# Patient Record
Sex: Female | Born: 1945 | Race: White | Hispanic: No | Marital: Married | Smoking: Never smoker
Health system: Southern US, Community
[De-identification: ages and names within clinical notes are randomized; demographics above are authoritative.]

## PROBLEM LIST (undated history)

## (undated) DIAGNOSIS — J189 Pneumonia, unspecified organism: Secondary | ICD-10-CM

## (undated) DIAGNOSIS — M1990 Unspecified osteoarthritis, unspecified site: Secondary | ICD-10-CM

## (undated) DIAGNOSIS — G35 Multiple sclerosis: Secondary | ICD-10-CM

## (undated) DIAGNOSIS — F329 Major depressive disorder, single episode, unspecified: Secondary | ICD-10-CM

## (undated) DIAGNOSIS — K5790 Diverticulosis of intestine, part unspecified, without perforation or abscess without bleeding: Secondary | ICD-10-CM

## (undated) DIAGNOSIS — N319 Neuromuscular dysfunction of bladder, unspecified: Secondary | ICD-10-CM

## (undated) DIAGNOSIS — R413 Other amnesia: Secondary | ICD-10-CM

## (undated) DIAGNOSIS — R269 Unspecified abnormalities of gait and mobility: Secondary | ICD-10-CM

## (undated) DIAGNOSIS — G894 Chronic pain syndrome: Secondary | ICD-10-CM

## (undated) DIAGNOSIS — I10 Essential (primary) hypertension: Secondary | ICD-10-CM

## (undated) DIAGNOSIS — G5620 Lesion of ulnar nerve, unspecified upper limb: Secondary | ICD-10-CM

## (undated) DIAGNOSIS — F419 Anxiety disorder, unspecified: Secondary | ICD-10-CM

## (undated) DIAGNOSIS — J449 Chronic obstructive pulmonary disease, unspecified: Secondary | ICD-10-CM

## (undated) DIAGNOSIS — J45909 Unspecified asthma, uncomplicated: Secondary | ICD-10-CM

## (undated) DIAGNOSIS — F32A Depression, unspecified: Secondary | ICD-10-CM

## (undated) DIAGNOSIS — N390 Urinary tract infection, site not specified: Secondary | ICD-10-CM

## (undated) HISTORY — DX: Other amnesia: R41.3

## (undated) HISTORY — DX: Unspecified abnormalities of gait and mobility: R26.9

## (undated) HISTORY — DX: Neuromuscular dysfunction of bladder, unspecified: N31.9

## (undated) HISTORY — DX: Chronic obstructive pulmonary disease, unspecified: J44.9

## (undated) HISTORY — PX: CATARACT EXTRACTION: SUR2

## (undated) HISTORY — PX: BACK SURGERY: SHX140

## (undated) HISTORY — DX: Chronic pain syndrome: G89.4

## (undated) HISTORY — DX: Essential (primary) hypertension: I10

## (undated) HISTORY — DX: Diverticulosis of intestine, part unspecified, without perforation or abscess without bleeding: K57.90

## (undated) HISTORY — DX: Unspecified asthma, uncomplicated: J45.909

## (undated) HISTORY — PX: TUBAL LIGATION: SHX77

## (undated) HISTORY — DX: Lesion of ulnar nerve, unspecified upper limb: G56.20

## (undated) HISTORY — PX: APPENDECTOMY: SHX54

## (undated) HISTORY — DX: Unspecified osteoarthritis, unspecified site: M19.90

## (undated) HISTORY — PX: TONSILLECTOMY: SUR1361

## (undated) HISTORY — PX: CHOLECYSTECTOMY: SHX55

## (undated) HISTORY — PX: ABDOMINAL HYSTERECTOMY: SHX81

## (undated) HISTORY — DX: Urinary tract infection, site not specified: N39.0

---

## 2011-04-30 ENCOUNTER — Encounter (HOSPITAL_COMMUNITY): Payer: Self-pay | Admitting: Pharmacy Technician

## 2011-05-03 ENCOUNTER — Encounter (HOSPITAL_COMMUNITY)
Admission: RE | Admit: 2011-05-03 | Discharge: 2011-05-03 | Disposition: A | Discharge status: Home / Self Care | Payer: Medicare FFS | Source: Ambulatory Visit | Attending: Orthopedic Surgery | Admitting: Orthopedic Surgery

## 2011-05-03 ENCOUNTER — Other Ambulatory Visit: Payer: Self-pay | Admitting: Orthopedic Surgery

## 2011-05-03 ENCOUNTER — Encounter (HOSPITAL_COMMUNITY): Payer: Self-pay

## 2011-05-03 HISTORY — DX: Major depressive disorder, single episode, unspecified: F32.9

## 2011-05-03 HISTORY — DX: Depression, unspecified: F32.A

## 2011-05-03 HISTORY — DX: Pneumonia, unspecified organism: J18.9

## 2011-05-03 HISTORY — DX: Anxiety disorder, unspecified: F41.9

## 2011-05-03 HISTORY — DX: Multiple sclerosis: G35

## 2011-05-03 HISTORY — DX: Unspecified osteoarthritis, unspecified site: M19.90

## 2011-05-03 LAB — ABO/RH: ABO/RH(D): O POS

## 2011-05-03 LAB — COMPREHENSIVE METABOLIC PANEL
AST: 21 U/L (ref 0–37)
Alkaline Phosphatase: 104 U/L (ref 39–117)
BUN: 14 mg/dL (ref 6–23)
CO2: 28 mEq/L (ref 19–32)
Chloride: 101 mEq/L (ref 96–112)
Creatinine, Ser: 0.76 mg/dL (ref 0.50–1.10)
GFR calc non Af Amer: 86 mL/min — ABNORMAL LOW (ref >90)
Potassium: 3.5 mEq/L (ref 3.5–5.1)
Total Bilirubin: 0.5 mg/dL (ref 0.3–1.2)

## 2011-05-03 LAB — CBC
MCH: 28.1 pg (ref 26.0–34.0)
MCV: 84.6 fL (ref 78.0–100.0)
Platelets: 277 10*3/uL (ref 150–400)
RBC: 4.81 MIL/uL (ref 3.87–5.11)

## 2011-05-03 LAB — URINALYSIS, ROUTINE W REFLEX MICROSCOPIC
Bilirubin Urine: NEGATIVE
Glucose, UA: NEGATIVE mg/dL
Hgb urine dipstick: NEGATIVE
Ketones, ur: NEGATIVE mg/dL
Leukocytes, UA: NEGATIVE
Protein, ur: NEGATIVE mg/dL

## 2011-05-03 LAB — DIFFERENTIAL
Basophils Absolute: 0.1 10*3/uL (ref 0.0–0.1)
Basophils Relative: 1 % (ref 0–1)
Eosinophils Absolute: 0.5 10*3/uL (ref 0.0–0.7)
Monocytes Absolute: 0.7 10*3/uL (ref 0.1–1.0)
Neutro Abs: 4.6 10*3/uL (ref 1.7–7.7)
Neutrophils Relative %: 60 % (ref 43–77)

## 2011-05-03 MED ORDER — CHLORHEXIDINE GLUCONATE 4 % EX LIQD: 60.0000 mL | Freq: Once | CUTANEOUS | Status: DC

## 2011-05-03 NOTE — Progress Notes (Signed)
pcp   Is  Dr Loraine Leriche Leary Roca

## 2011-05-03 NOTE — Pre-Procedure Instructions (Signed)
19 Casey Young  05/03/2011   Your procedure is scheduled on:  05/07/11  Report to [facility] at 915 AM.  Call this number if you have problems the morning of surgery: [phone]   Remember:   Do not eat food:After Midnight.  May have clear liquids: up to 4 Hours before arrival.  Clear liquids include soda, tea, black coffee, apple or grape juice, broth.  Take these medicines the morning of surgery with A SIP OF WATER: xanax,prozac,neurontin,methodone   Do not wear jewelry, make-up or nail polish.  Do not wear lotions, powders, or perfumes. You may wear deodorant.  Do not shave 48 hours prior to surgery.  Do not bring valuables to the hospital.  Contacts, dentures or bridgework may not be worn into surgery.  Leave suitcase in the car. After surgery it may be brought to your room.  For patients admitted to the hospital, checkout time is 11:00 AM the day of discharge.   Patients discharged the day of surgery will not be allowed to drive home.  Name and phone number of your driver: family  Special Instructions: CHG Shower Use Special Wash: 1/2 bottle night before surgery and 1/2 bottle morning of surgery.   Please read over the following fact sheets that you were given: Pain Booklet, Coughing and Deep Breathing, Blood Transfusion Information, MRSA Information and Surgical Site Infection Prevention

## 2011-05-04 LAB — URINE CULTURE
Colony Count: NO GROWTH
Culture  Setup Time: 201304181042
Culture: NO GROWTH

## 2011-05-06 MED ORDER — ACETAMINOPHEN 10 MG/ML IV SOLN
1000.0000 mg | Freq: Four times a day (QID) | INTRAVENOUS | Status: DC
  Filled 2011-05-06 (×4): qty 100

## 2011-05-06 MED ORDER — SODIUM CHLORIDE 0.9 % IV SOLN: INTRAVENOUS | Status: DC

## 2011-05-07 ENCOUNTER — Encounter (HOSPITAL_COMMUNITY): Payer: Self-pay | Admitting: Surgery

## 2011-05-07 ENCOUNTER — Ambulatory Visit (HOSPITAL_COMMUNITY): Payer: Medicare FFS | Admitting: Anesthesiology

## 2011-05-07 ENCOUNTER — Encounter (HOSPITAL_COMMUNITY): Payer: Self-pay | Admitting: Anesthesiology

## 2011-05-07 ENCOUNTER — Inpatient Hospital Stay (HOSPITAL_COMMUNITY)
Admission: RE | Admit: 2011-05-07 | Discharge: 2011-05-08 | DRG: 483 | Disposition: A | Discharge status: Home Health | Payer: Medicare FFS | Source: Ambulatory Visit | Attending: Orthopedic Surgery | Admitting: Orthopedic Surgery

## 2011-05-07 ENCOUNTER — Encounter (HOSPITAL_COMMUNITY)
Admission: RE | Disposition: A | Discharge status: Home Health | Payer: Self-pay | Source: Ambulatory Visit | Attending: Orthopedic Surgery

## 2011-05-07 DIAGNOSIS — Z880 Allergy status to penicillin: Secondary | ICD-10-CM

## 2011-05-07 DIAGNOSIS — Z79899 Other long term (current) drug therapy: Secondary | ICD-10-CM

## 2011-05-07 DIAGNOSIS — M19019 Primary osteoarthritis, unspecified shoulder: Principal | ICD-10-CM | POA: Diagnosis present

## 2011-05-07 DIAGNOSIS — D62 Acute posthemorrhagic anemia: Secondary | ICD-10-CM | POA: Diagnosis not present

## 2011-05-07 DIAGNOSIS — Z01812 Encounter for preprocedural laboratory examination: Secondary | ICD-10-CM

## 2011-05-07 DIAGNOSIS — G35 Multiple sclerosis: Secondary | ICD-10-CM | POA: Diagnosis present

## 2011-05-07 DIAGNOSIS — F341 Dysthymic disorder: Secondary | ICD-10-CM | POA: Diagnosis present

## 2011-05-07 HISTORY — PX: SHOULDER HEMI-ARTHROPLASTY: SHX5049

## 2011-05-07 SURGERY — HEMIARTHROPLASTY, SHOULDER
Anesthesia: General | Site: Shoulder | Laterality: Left | Wound class: Clean

## 2011-05-07 MED ORDER — SODIUM CHLORIDE 0.9 % IV SOLN
INTRAVENOUS | Status: DC | PRN
  Administered 2011-05-07: 11:00:00 via INTRAVENOUS

## 2011-05-07 MED ORDER — SODIUM CHLORIDE 0.9 % IV SOLN
INTRAVENOUS | Status: DC
  Administered 2011-05-07: 15:00:00 via INTRAVENOUS

## 2011-05-07 MED ORDER — ACETAMINOPHEN 650 MG RE SUPP: 650.0000 mg | Freq: Four times a day (QID) | RECTAL | Status: DC | PRN

## 2011-05-07 MED ORDER — BUPIVACAINE-EPINEPHRINE PF 0.5-1:200000 % IJ SOLN
INTRAMUSCULAR | Status: DC | PRN
  Administered 2011-05-07: 30 mL

## 2011-05-07 MED ORDER — HYDROMORPHONE HCL PF 1 MG/ML IJ SOLN: 0.5000 mg | INTRAMUSCULAR | Status: DC | PRN

## 2011-05-07 MED ORDER — ALPRAZOLAM 0.5 MG PO TABS
0.5000 mg | ORAL_TABLET | Freq: Three times a day (TID) | ORAL | Status: DC
  Administered 2011-05-07 – 2011-05-08 (×4): 0.5 mg via ORAL
  Filled 2011-05-07 (×4): qty 1

## 2011-05-07 MED ORDER — LACTATED RINGERS IV SOLN
INTRAVENOUS | Status: DC
  Administered 2011-05-07: 10:00:00 via INTRAVENOUS

## 2011-05-07 MED ORDER — METHADONE HCL 10 MG PO TABS
10.0000 mg | ORAL_TABLET | Freq: Two times a day (BID) | ORAL | Status: DC
  Administered 2011-05-07 – 2011-05-08 (×2): 10 mg via ORAL
  Filled 2011-05-07 (×2): qty 1

## 2011-05-07 MED ORDER — FLEET ENEMA 7-19 GM/118ML RE ENEM: 1.0000 | ENEMA | Freq: Once | RECTAL | Status: AC | PRN

## 2011-05-07 MED ORDER — ROCURONIUM BROMIDE 100 MG/10ML IV SOLN
INTRAVENOUS | Status: DC | PRN
  Administered 2011-05-07: 50 mg via INTRAVENOUS

## 2011-05-07 MED ORDER — MIDAZOLAM HCL 2 MG/2ML IJ SOLN
2.0000 mg | INTRAMUSCULAR | Status: DC | PRN
  Administered 2011-05-07: 2 mg via INTRAVENOUS

## 2011-05-07 MED ORDER — FENTANYL CITRATE 0.05 MG/ML IJ SOLN
100.0000 ug | INTRAMUSCULAR | Status: DC | PRN
  Administered 2011-05-07: 100 ug via INTRAVENOUS

## 2011-05-07 MED ORDER — ONDANSETRON HCL 4 MG/2ML IJ SOLN: 4.0000 mg | Freq: Four times a day (QID) | INTRAMUSCULAR | Status: DC | PRN

## 2011-05-07 MED ORDER — MIDAZOLAM HCL 2 MG/2ML IJ SOLN
INTRAMUSCULAR | Status: AC
  Filled 2011-05-07: qty 2

## 2011-05-07 MED ORDER — ACETAMINOPHEN 10 MG/ML IV SOLN
INTRAVENOUS | Status: AC
  Filled 2011-05-07: qty 100

## 2011-05-07 MED ORDER — PHENOL 1.4 % MT LIQD: 1.0000 | OROMUCOSAL | Status: DC | PRN

## 2011-05-07 MED ORDER — EPHEDRINE SULFATE 50 MG/ML IJ SOLN
INTRAMUSCULAR | Status: DC | PRN
  Administered 2011-05-07: 7.5 mg via INTRAVENOUS

## 2011-05-07 MED ORDER — ONDANSETRON HCL 4 MG PO TABS: 4.0000 mg | ORAL_TABLET | Freq: Four times a day (QID) | ORAL | Status: DC | PRN

## 2011-05-07 MED ORDER — HYDROMORPHONE HCL PF 1 MG/ML IJ SOLN
0.2500 mg | INTRAMUSCULAR | Status: DC | PRN
  Administered 2011-05-07: 0.25 mg via INTRAVENOUS

## 2011-05-07 MED ORDER — HETASTARCH-ELECTROLYTES 6 % IV SOLN
INTRAVENOUS | Status: DC | PRN
  Administered 2011-05-07: 12:00:00 via INTRAVENOUS

## 2011-05-07 MED ORDER — AMITRIPTYLINE HCL 100 MG PO TABS
100.0000 mg | ORAL_TABLET | Freq: Every day | ORAL | Status: DC
  Administered 2011-05-07: 100 mg via ORAL
  Filled 2011-05-07 (×2): qty 1

## 2011-05-07 MED ORDER — LIDOCAINE HCL (CARDIAC) 20 MG/ML IV SOLN
INTRAVENOUS | Status: DC | PRN
  Administered 2011-05-07: 90 mg via INTRAVENOUS

## 2011-05-07 MED ORDER — ACETAMINOPHEN 10 MG/ML IV SOLN
1000.0000 mg | Freq: Once | INTRAVENOUS | Status: DC
  Filled 2011-05-07: qty 100

## 2011-05-07 MED ORDER — VANCOMYCIN HCL IN DEXTROSE 1-5 GM/200ML-% IV SOLN
1000.0000 mg | Freq: Once | INTRAVENOUS | Status: AC
  Administered 2011-05-07: 1000 mg via INTRAVENOUS

## 2011-05-07 MED ORDER — METOCLOPRAMIDE HCL 5 MG/ML IJ SOLN: 5.0000 mg | Freq: Three times a day (TID) | INTRAMUSCULAR | Status: DC | PRN

## 2011-05-07 MED ORDER — VANCOMYCIN HCL IN DEXTROSE 1-5 GM/200ML-% IV SOLN
1000.0000 mg | Freq: Two times a day (BID) | INTRAVENOUS | Status: AC
  Administered 2011-05-07: 1000 mg via INTRAVENOUS
  Filled 2011-05-07: qty 200

## 2011-05-07 MED ORDER — FENTANYL CITRATE 0.05 MG/ML IJ SOLN
INTRAMUSCULAR | Status: DC | PRN
  Administered 2011-05-07: 100 ug via INTRAVENOUS

## 2011-05-07 MED ORDER — MELOXICAM 15 MG PO TABS
15.0000 mg | ORAL_TABLET | Freq: Every day | ORAL | Status: DC
  Administered 2011-05-07 – 2011-05-08 (×2): 15 mg via ORAL
  Filled 2011-05-07 (×2): qty 1

## 2011-05-07 MED ORDER — ZOLPIDEM TARTRATE 5 MG PO TABS: 5.0000 mg | ORAL_TABLET | Freq: Every evening | ORAL | Status: DC | PRN

## 2011-05-07 MED ORDER — FENTANYL CITRATE 0.05 MG/ML IJ SOLN
INTRAMUSCULAR | Status: AC
  Filled 2011-05-07: qty 2

## 2011-05-07 MED ORDER — DEXTROSE 5 % IV SOLN: 500.0000 mg | Freq: Four times a day (QID) | INTRAVENOUS | Status: DC | PRN

## 2011-05-07 MED ORDER — FLUOXETINE HCL 20 MG PO CAPS
40.0000 mg | ORAL_CAPSULE | Freq: Every day | ORAL | Status: DC
  Administered 2011-05-07 – 2011-05-08 (×2): 40 mg via ORAL
  Filled 2011-05-07 (×2): qty 2

## 2011-05-07 MED ORDER — MENTHOL 3 MG MT LOZG: 1.0000 | LOZENGE | OROMUCOSAL | Status: DC | PRN

## 2011-05-07 MED ORDER — PROPOFOL 10 MG/ML IV EMUL
INTRAVENOUS | Status: DC | PRN
  Administered 2011-05-07: 160 mg via INTRAVENOUS

## 2011-05-07 MED ORDER — SENNOSIDES-DOCUSATE SODIUM 8.6-50 MG PO TABS: 1.0000 | ORAL_TABLET | Freq: Every evening | ORAL | Status: DC | PRN

## 2011-05-07 MED ORDER — SODIUM CHLORIDE 0.9 % IR SOLN
Status: DC | PRN
  Administered 2011-05-07: 1000 mL

## 2011-05-07 MED ORDER — ONDANSETRON HCL 4 MG/2ML IJ SOLN
INTRAMUSCULAR | Status: DC | PRN
  Administered 2011-05-07: 4 mg via INTRAVENOUS

## 2011-05-07 MED ORDER — LACTATED RINGERS IV SOLN
INTRAVENOUS | Status: DC | PRN
  Administered 2011-05-07 (×2): via INTRAVENOUS

## 2011-05-07 MED ORDER — DOCUSATE SODIUM 100 MG PO CAPS
100.0000 mg | ORAL_CAPSULE | Freq: Two times a day (BID) | ORAL | Status: DC
  Administered 2011-05-07 – 2011-05-08 (×2): 100 mg via ORAL
  Filled 2011-05-07 (×3): qty 1

## 2011-05-07 MED ORDER — SODIUM CHLORIDE 0.9 % IV SOLN
10.0000 mg | INTRAVENOUS | Status: DC | PRN
  Administered 2011-05-07: 80 ug/min via INTRAVENOUS

## 2011-05-07 MED ORDER — METHOCARBAMOL 500 MG PO TABS
500.0000 mg | ORAL_TABLET | Freq: Four times a day (QID) | ORAL | Status: DC | PRN
  Administered 2011-05-07: 500 mg via ORAL
  Filled 2011-05-07: qty 1

## 2011-05-07 MED ORDER — PHENYLEPHRINE HCL 10 MG/ML IJ SOLN
INTRAMUSCULAR | Status: DC | PRN
  Administered 2011-05-07 (×6): 80 ug via INTRAVENOUS

## 2011-05-07 MED ORDER — GLYCOPYRROLATE 0.2 MG/ML IJ SOLN
INTRAMUSCULAR | Status: DC | PRN
  Administered 2011-05-07: 0.6 mg via INTRAVENOUS

## 2011-05-07 MED ORDER — METOCLOPRAMIDE HCL 10 MG PO TABS: 5.0000 mg | ORAL_TABLET | Freq: Three times a day (TID) | ORAL | Status: DC | PRN

## 2011-05-07 MED ORDER — OXYCODONE HCL 10 MG PO TB12
10.0000 mg | ORAL_TABLET | Freq: Two times a day (BID) | ORAL | Status: DC
  Administered 2011-05-07 – 2011-05-08 (×2): 10 mg via ORAL
  Filled 2011-05-07 (×2): qty 1

## 2011-05-07 MED ORDER — ACETAMINOPHEN 325 MG PO TABS: 650.0000 mg | ORAL_TABLET | Freq: Four times a day (QID) | ORAL | Status: DC | PRN

## 2011-05-07 MED ORDER — BISACODYL 5 MG PO TBEC: 5.0000 mg | DELAYED_RELEASE_TABLET | Freq: Every day | ORAL | Status: DC | PRN

## 2011-05-07 MED ORDER — ALUM & MAG HYDROXIDE-SIMETH 200-200-20 MG/5ML PO SUSP: 30.0000 mL | ORAL | Status: DC | PRN

## 2011-05-07 MED ORDER — OXYCODONE HCL 5 MG PO TABS
5.0000 mg | ORAL_TABLET | ORAL | Status: DC | PRN
  Administered 2011-05-08 (×2): 10 mg via ORAL
  Filled 2011-05-07 (×2): qty 2

## 2011-05-07 MED ORDER — GABAPENTIN 300 MG PO CAPS
600.0000 mg | ORAL_CAPSULE | Freq: Three times a day (TID) | ORAL | Status: DC
  Administered 2011-05-07 – 2011-05-08 (×4): 600 mg via ORAL
  Filled 2011-05-07 (×5): qty 2

## 2011-05-07 MED ORDER — DIPHENHYDRAMINE HCL 12.5 MG/5ML PO ELIX: 12.5000 mg | ORAL_SOLUTION | ORAL | Status: DC | PRN

## 2011-05-07 MED ORDER — NEOSTIGMINE METHYLSULFATE 1 MG/ML IJ SOLN
INTRAMUSCULAR | Status: DC | PRN
  Administered 2011-05-07: 4 mg via INTRAVENOUS

## 2011-05-07 SURGICAL SUPPLY — 64 items
APL SKNCLS STERI-STRIP NONHPOA (GAUZE/BANDAGES/DRESSINGS) ×2
BENZOIN TINCTURE PRP APPL 2/3 (GAUZE/BANDAGES/DRESSINGS) ×2 IMPLANT
BLADE SAGITTAL 25.0X1.19X90 (BLADE) ×3 IMPLANT
BOWL SMART MIX CTS (DISPOSABLE) IMPLANT
CLOTH BEACON ORANGE TIMEOUT ST (SAFETY) ×3 IMPLANT
COVER SURGICAL LIGHT HANDLE (MISCELLANEOUS) ×3 IMPLANT
COVER TABLE BACK 60X90 (DRAPES) ×1 IMPLANT
DRAPE C-ARM 42X72 X-RAY (DRAPES) IMPLANT
DRAPE INCISE IOBAN 66X45 STRL (DRAPES) ×2 IMPLANT
DRAPE PROXIMA HALF (DRAPES) ×1 IMPLANT
DRAPE U-SHAPE 47X51 STRL (DRAPES) ×4 IMPLANT
DRSG PAD ABDOMINAL 8X10 ST (GAUZE/BANDAGES/DRESSINGS) ×4 IMPLANT
DURAPREP 26ML APPLICATOR (WOUND CARE) ×3 IMPLANT
ELECT CAUTERY BLADE 6.4 (BLADE) ×3 IMPLANT
ELECT NDL TIP 2.8 STRL (NEEDLE) IMPLANT
ELECT NEEDLE TIP 2.8 STRL (NEEDLE) ×3 IMPLANT
ELECT REM PT RETURN 9FT ADLT (ELECTROSURGICAL) ×3
ELECTRODE REM PT RTRN 9FT ADLT (ELECTROSURGICAL) ×2 IMPLANT
EVACUATOR 1/8 PVC DRAIN (DRAIN) IMPLANT
GAUZE XEROFORM 5X9 LF (GAUZE/BANDAGES/DRESSINGS) ×3 IMPLANT
GLOVE BIOGEL M 7.0 STRL (GLOVE) ×2 IMPLANT
GLOVE BIOGEL PI IND STRL 7.5 (GLOVE) ×1 IMPLANT
GLOVE BIOGEL PI IND STRL 8.5 (GLOVE) ×3 IMPLANT
GLOVE BIOGEL PI INDICATOR 7.5 (GLOVE) ×1
GLOVE BIOGEL PI INDICATOR 8.5 (GLOVE) ×1
GLOVE EXAM NITRILE XL STR (GLOVE) ×2 IMPLANT
GLOVE SURG ORTHO 8.0 STRL STRW (GLOVE) ×4 IMPLANT
GLOVE SURG SS PI 7.5 STRL IVOR (GLOVE) ×4 IMPLANT
GOWN PREVENTION PLUS XLARGE (GOWN DISPOSABLE) ×5 IMPLANT
GOWN STRL NON-REIN LRG LVL3 (GOWN DISPOSABLE) ×4 IMPLANT
HANDPIECE INTERPULSE COAX TIP (DISPOSABLE)
HOOD PEEL AWAY FACE SHEILD DIS (HOOD) ×9 IMPLANT
KIT BASIN OR (CUSTOM PROCEDURE TRAY) ×3 IMPLANT
KIT ROOM TURNOVER OR (KITS) ×3 IMPLANT
MANIFOLD NEPTUNE II (INSTRUMENTS) ×3 IMPLANT
NDL MAYO TROCAR (NEEDLE) ×1 IMPLANT
NEEDLE 22X1 1/2 (OR ONLY) (NEEDLE) ×2 IMPLANT
NEEDLE MAYO TROCAR (NEEDLE) ×3 IMPLANT
NS IRRIG 1000ML POUR BTL (IV SOLUTION) ×3 IMPLANT
PACK SHOULDER (CUSTOM PROCEDURE TRAY) ×3 IMPLANT
PAD ARMBOARD 7.5X6 YLW CONV (MISCELLANEOUS) ×4 IMPLANT
PASSER SUT SWANSON 36MM LOOP (INSTRUMENTS) ×2 IMPLANT
SET HNDPC FAN SPRY TIP SCT (DISPOSABLE) IMPLANT
SLING ARM IMMOBILIZER LRG (SOFTGOODS) ×2 IMPLANT
SLING ARM IMMOBILIZER MED (SOFTGOODS) IMPLANT
SMARTMIX MINI TOWER (MISCELLANEOUS) IMPLANT
SPONGE GAUZE 4X4 12PLY (GAUZE/BANDAGES/DRESSINGS) ×2 IMPLANT
SPONGE LAP 18X18 X RAY DECT (DISPOSABLE) ×4 IMPLANT
SPONGE LAP 4X18 X RAY DECT (DISPOSABLE) ×4 IMPLANT
STAPLER VISISTAT 35W (STAPLE) ×1 IMPLANT
STRIP CLOSURE SKIN 1/2X4 (GAUZE/BANDAGES/DRESSINGS) ×2 IMPLANT
SUCTION FRAZIER TIP 10 FR DISP (SUCTIONS) ×3 IMPLANT
SUT ETHIBOND 2 V 37 (SUTURE) ×4 IMPLANT
SUT ETHIBOND NAB CT1 #1 30IN (SUTURE) ×4 IMPLANT
SUT MNCRL AB 3-0 PS2 18 (SUTURE) ×2 IMPLANT
SUT VIC AB 0 CT1 27 (SUTURE) ×6
SUT VIC AB 0 CT1 27XBRD ANBCTR (SUTURE) ×4 IMPLANT
SUT VIC AB 2-0 CT1 27 (SUTURE) ×9
SUT VIC AB 2-0 CT1 TAPERPNT 27 (SUTURE) ×4 IMPLANT
SUT VICRYL 0 TIES 12 18 (SUTURE) ×1 IMPLANT
SYR CONTROL 10ML LL (SYRINGE) ×2 IMPLANT
SYRINGE 10CC LL (SYRINGE) IMPLANT
TRAY FOLEY CATH 14FR (SET/KITS/TRAYS/PACK) IMPLANT
WATER STERILE IRR 1000ML POUR (IV SOLUTION) ×3 IMPLANT

## 2011-05-07 NOTE — Progress Notes (Signed)
Orthopedic Tech Progress Note Patient Details:  Casey Young 1945-10-12 [id number]  Other Ortho Devices Type of Ortho Device: Shoulder abduction pillow Ortho Device Interventions: Application Shoulder pillow left at OR desk.  Bob Daversa T 05/07/2011, 1:18 PM

## 2011-05-07 NOTE — Anesthesia Preprocedure Evaluation (Signed)
Anesthesia Evaluation  Patient identified by MRN, date of birth, ID band Patient awake    Reviewed: Allergy & Precautions, H&P , NPO status , Patient's Chart, lab work & pertinent test results  Airway Mallampati: II  Neck ROM: full    Dental   Pulmonary          Cardiovascular     Neuro/Psych PSYCHIATRIC DISORDERS Anxiety Depression Multiple sclerosis    GI/Hepatic   Endo/Other    Renal/GU      Musculoskeletal  (+) Arthritis -,   Abdominal   Peds  Hematology   Anesthesia Other Findings   Reproductive/Obstetrics                           Anesthesia Physical Anesthesia Plan  ASA: II  Anesthesia Plan: General and Regional   Post-op Pain Management: MAC Combined w/ Regional for Post-op pain   Induction: Intravenous  Airway Management Planned: Oral ETT  Additional Equipment:   Intra-op Plan:   Post-operative Plan: Extubation in OR  Informed Consent: I have reviewed the patients History and Physical, chart, labs and discussed the procedure including the risks, benefits and alternatives for the proposed anesthesia with the patient or authorized representative who has indicated his/her understanding and acceptance.     Plan Discussed with: CRNA and Surgeon  Anesthesia Plan Comments:         Anesthesia Quick Evaluation

## 2011-05-07 NOTE — Transfer of Care (Signed)
Immediate Anesthesia Transfer of Care Note  Patient: Casey Young  Procedure(s) Performed: Procedure(s) (LRB): SHOULDER HEMI-ARTHROPLASTY (Left)  Patient Location: PACU  Anesthesia Type: General  Level of Consciousness: awake, alert , oriented and patient cooperative  Airway & Oxygen Therapy: Patient Spontanous Breathing and Patient connected to nasal cannula oxygen  Post-op Assessment: Report given to PACU RN, Post -op Vital signs reviewed and stable, Patient moving all extremities and Patient able to stick tongue midline  Post vital signs: Reviewed and stable  Complications: No apparent anesthesia complications

## 2011-05-07 NOTE — Anesthesia Postprocedure Evaluation (Signed)
  Anesthesia Post-op Note  Patient: Casey Young  Procedure(s) Performed: Procedure(s) (LRB): SHOULDER HEMI-ARTHROPLASTY (Left)  Patient Location: PACU  Anesthesia Type: General and GA combined with regional for post-op pain  Level of Consciousness: awake, alert  and oriented  Airway and Oxygen Therapy: Patient Spontanous Breathing and Patient connected to nasal cannula oxygen  Post-op Pain: none  Post-op Assessment: Post-op Vital signs reviewed and Patient's Cardiovascular Status Stable  Post-op Vital Signs: stable  Complications: No apparent anesthesia complications

## 2011-05-07 NOTE — H&P (Signed)
  Casey Young MRN:  [id number] DOB/SEX:  16-Feb-1945/female  CHIEF COMPLAINT:  Painful left shoulder  HISTORY: Patient is a 66 y.o. female presented with a history of pain in the left shoulder. Onset of symptoms was gradual starting several years ago with gradually worsening course since that time. The patient noted no past surgery on the left shoulder. Prior procedures on the shoulder include decompression. Patient has been treated conservatively with over-the-counter NSAIDs and activity modification. Patient currently rates pain in the knee at 10 out of 10 with activity. There is pain at night.  PAST MEDICAL HISTORY: There are no active problems to display for this patient.  Past Medical History  Diagnosis Date  . Pneumonia     over 5 yrs ago  . Arthritis   . MS (multiple sclerosis)   . Anxiety   . Depression    Past Surgical History  Procedure Date  . Tonsillectomy   . Cholecystectomy   . Appendectomy   . Abdominal hysterectomy   . Back surgery     lumbar   x 3 surgeries     MEDICATIONS:   No prescriptions prior to admission    ALLERGIES:   Allergies  Allergen Reactions  . Penicillins Rash    REVIEW OF SYSTEMS:  Pertinent items are noted in HPI.   FAMILY HISTORY:  No family history on file.  SOCIAL HISTORY:   History  Substance Use Topics  . Smoking status: Never Smoker   . Smokeless tobacco: Not on file  . Alcohol Use: No     EXAMINATION:  Vital signs in last 24 hours:    General appearance: alert, cooperative and no distress Lungs: clear to auscultation bilaterally Heart: regular rate and rhythm, S1, S2 normal, no murmur, click, rub or gallop Abdomen: soft, non-tender; bowel sounds normal; no masses,  no organomegaly Extremities: extremities normal, atraumatic, no cyanosis or edema Pulses: 2+ and symmetric Skin: Skin color, texture, turgor normal. No rashes or lesions Neurologic: Alert and oriented X 3, normal strength and tone. Normal  symmetric reflexes. Normal coordination and gait  Musculoskeletal:  ROM decreased, Ligaments intact,  Imaging Review Plain radiographs demonstrate severe degenerative joint disease of the left shoulder. The bone quality appears to be good for age and reported activity level.  Assessment/Plan: End stage arthritis, left shoulder  The patient history, physical examination and imaging studies are consistent with advanced degenerative joint disease of the left shoulder. The patient has failed conservative treatment.  The clearance notes were reviewed.  After discussion with the patient it was felt that Total Shoulder Replacement was indicated. The procedure,  risks, and benefits of total knee arthroplasty were presented and reviewed. The risks including but not limited to aseptic loosening, infection, blood clots, vascular injury, stiffness among others were discussed. The patient acknowledged the explanation, agreed to proceed with the plan.  Casey Young 05/07/2011, 7:19 AM

## 2011-05-07 NOTE — Preoperative (Addendum)
Beta Blockers   Reason not to administer Beta Blockers:Pt takes Atenolol every morning and did not understand to take it this morning.  To evaluate need intra-op.

## 2011-05-07 NOTE — Progress Notes (Signed)
Pt arrived from PACU needing to void. Assisted her to bathroom, but pt was unable to void independently. Bladder scan showed >999cc, received order to I&O cath. Output was 1000cc and cath was removed. Pt stated that she felt much relief after cath. Will continue to monitor output. Tammy Sours

## 2011-05-07 NOTE — Anesthesia Preprocedure Evaluation (Deleted)
Anesthesia Evaluation Anesthesia Physical Anesthesia Plan Anesthesia Quick Evaluation  

## 2011-05-07 NOTE — Anesthesia Procedure Notes (Addendum)
Anesthesia Regional Block:  Interscalene brachial plexus block  Pre-Anesthetic Checklist: ,, timeout performed, Correct Patient, Correct Site, Correct Laterality, Correct Procedure, Correct Position, site marked, Risks and benefits discussed, pre-op evaluation,  At surgeon's request and post-op pain management  Laterality: Left  Prep: Maximum Sterile Barrier Precautions used and chloraprep       Needles:  Injection technique: Single-shot  Needle Type: Echogenic Stimulator Needle      Needle Gauge: 22 and 22 G    Additional Needles:  Procedures: ultrasound guided and nerve stimulator Interscalene brachial plexus block  Nerve Stimulator or Paresthesia:  Response: Biceps response, 0.45 mA,   Additional Responses:   Narrative:  Start time: 05/07/2011 10:35 AM End time: 05/07/2011 10:50 AM Injection made incrementally with aspirations every 5 mL. Anesthesiologist: Sampson Goon, MD  Additional Notes: 2% Lidocaine skin wheel.   Interscalene brachial plexus block Procedure Name: Intubation Date/Time: 05/07/2011 11:33 AM Performed by: Darcey Nora B Pre-anesthesia Checklist: Patient identified, Emergency Drugs available, Suction available and Patient being monitored Patient Re-evaluated:Patient Re-evaluated prior to inductionOxygen Delivery Method: Circle system utilized Preoxygenation: Pre-oxygenation with 100% oxygen Ventilation: Mask ventilation without difficulty Grade View: Grade I Tube type: Oral Tube size: 7.5 mm Number of attempts: 1 Airway Equipment and Method: Stylet Placement Confirmation: ETT inserted through vocal cords under direct vision,  breath sounds checked- equal and bilateral and positive ETCO2 Secured at: 21 (cm at teeth) cm Tube secured with: Tape Dental Injury: Teeth and Oropharynx as per pre-operative assessment

## 2011-05-08 ENCOUNTER — Encounter (HOSPITAL_COMMUNITY): Payer: Self-pay | Admitting: Orthopedic Surgery

## 2011-05-08 LAB — CBC
Hemoglobin: 10.7 g/dL — ABNORMAL LOW (ref 12.0–15.0)
MCH: 27.2 pg (ref 26.0–34.0)
MCHC: 31.8 g/dL (ref 30.0–36.0)
MCV: 85.5 fL (ref 78.0–100.0)
RBC: 3.94 MIL/uL (ref 3.87–5.11)

## 2011-05-08 LAB — BASIC METABOLIC PANEL
CO2: 26 mEq/L (ref 19–32)
Calcium: 8.1 mg/dL — ABNORMAL LOW (ref 8.4–10.5)
Creatinine, Ser: 0.75 mg/dL (ref 0.50–1.10)
GFR calc non Af Amer: 86 mL/min — ABNORMAL LOW (ref >90)
Glucose, Bld: 125 mg/dL — ABNORMAL HIGH (ref 70–99)

## 2011-05-08 MED ORDER — WHITE PETROLATUM GEL
Status: AC
  Administered 2011-05-08: 10:00:00
  Filled 2011-05-08: qty 5

## 2011-05-08 MED ORDER — OXYCODONE HCL 5 MG PO TABS: 5.0000 mg | ORAL_TABLET | ORAL | Status: AC | PRN

## 2011-05-08 MED ORDER — METHOCARBAMOL 500 MG PO TABS: 500.0000 mg | ORAL_TABLET | Freq: Four times a day (QID) | ORAL | Status: AC | PRN

## 2011-05-08 NOTE — Progress Notes (Signed)
Georgena Spurling, MD   [name], PA-C [address] [place], [place], [place]  [address]                             [phone]   PROGRESS NOTE  Subjective:  negative for Chest Pain  negative for Shortness of Breath  negative for Nausea/Vomiting   negative for Calf Pain  negative for Bowel Movement   Tolerating Diet: yes         Patient reports pain as 4 on 0-10 scale.    Objective: Vital signs in last 24 hours:   Patient Vitals for the past 24 hrs:  BP Temp Temp src Pulse Resp SpO2  05/08/11 0626 160/73 mmHg 97.2 F (36.2 C) - 95  20  93 %  05/07/11 2103 161/73 mmHg 97 F (36.1 C) - 89  20  92 %  05/07/11 1825 155/71 mmHg - - 70  - -  05/07/11 1630 193/90 mmHg 97.5 F (36.4 C) Oral 82  18  96 %  05/07/11 1600 - - - 86  15  97 %  05/07/11 1545 198/93 mmHg - - 78  15  97 %  05/07/11 1530 - - - 80  22  97 %  05/07/11 1523 184/99 mmHg - - - - -  05/07/11 1515 - - - 74  26  97 %  05/07/11 1500 155/78 mmHg - - 69  29  99 %  05/07/11 1445 - - - 75  11  98 %  05/07/11 1430 168/79 mmHg - - 73  24  98 %  05/07/11 1415 187/91 mmHg 97.3 F (36.3 C) - 82  31  94 %  05/07/11 1053 - - - 77  20  99 %  05/07/11 1052 - - - 79  21  96 %  05/07/11 1051 - - - 78  21  96 %  05/07/11 1050 - - - 71  22  96 %  05/07/11 1049 - - - 71  22  98 %  05/07/11 1048 - - - 74  18  98 %  05/07/11 1047 - - - 69  15  99 %  05/07/11 1046 - - - 74  14  96 %  05/07/11 1045 - - - 70  19  95 %  05/07/11 1044 - - - 70  20  95 %  05/07/11 1043 - - - 69  17  95 %  05/07/11 1042 - - - 70  19  95 %  05/07/11 1041 - - - 69  22  97 %  05/07/11 1040 - - - 69  12  98 %  05/07/11 1039 - - - 67  14  95 %  05/07/11 1038 - - - 65  12  98 %  05/07/11 1037 - - - 67  14  99 %  05/07/11 1036 - - - 67  15  98 %  05/07/11 1035 - - - 67  15  99 %  05/07/11 1034 - - - 70  17  97 %  05/07/11 1033 - - - 64  13  99 %  05/07/11 1032 - - - 64  11  98 %  05/07/11 1031 - - - 68  14  99 %  05/07/11 1030 - - - 67  15  98  %  05/07/11 1029 - - - 67  16  97 %  05/07/11 0851 189/82 mmHg 98 F (36.7 C) Oral 69  17  98 %    @flow {1959:LAST@   Intake/Output from previous day:   04/22 0701 - 04/23 0700 In: 2811.3 [I.V.:2311.3] Out: 1600 [Urine:1250]   Intake/Output this shift:       Intake/Output      04/22 0701 - 04/23 0700 04/23 0701 - 04/24 0700   I.V. 2311.3    IV Piggyback 500    Total Intake 2811.3    Urine 1250    Blood 350    Total Output 1600    Net +1211.3         Urine Occurrence 3 x       LABORATORY DATA:  Basename 05/08/11 0512 05/03/11 0936  WBC 7.5 7.6  HGB 10.7* 13.5  HCT 33.7* 40.7  PLT 256 277    Basename 05/08/11 0512 05/03/11 0936  NA 138 140  K 4.0 3.5  CL 104 101  CO2 26 28  BUN 11 14  CREATININE 0.75 0.76  GLUCOSE 125* 94  CALCIUM 8.1* 9.5   Lab Results  Component Value Date   INR 1.12 05/03/2011    Examination:   Wound Exam: clean, dry, intact   Drainage:  Scant/small amount Serosanguinous exudate  Motor Exam: EHL and FHL Intact  Sensory Exam: normal  Vascular Exam:  Assessment:    1 Day Post-Op  Procedure(s) (LRB): SHOULDER HEMI-ARTHROPLASTY (Left)  ADDITIONAL DIAGNOSIS:  Active Problems:  * No active hospital problems. *   Acute Blood Loss Anemia   Plan: Physical Therapy as ordered Weight Bearing as Tolerated (WBAT)  DVT Prophylaxis:  None  DISCHARGE PLAN: Home  DISCHARGE NEEDS: HHPT         Casey Young 05/08/2011, 8:13 AM

## 2011-05-08 NOTE — Discharge Summary (Signed)
PATIENT ID:      Casey Young  MRN:     [id number] DOB/AGE:    1945-06-24 / 66 y.o.     DISCHARGE SUMMARY  ADMISSION DATE:    05/07/2011 DISCHARGE DATE:   05/08/2011   ADMISSION DIAGNOSIS: osteoarthritis left shoulder    DISCHARGE DIAGNOSIS:  osteoarthritis left shoulder    ADDITIONAL DIAGNOSIS: Active Problems:  Osteoarthritis of left shoulder  Past Medical History  Diagnosis Date  . Pneumonia     over 5 yrs ago  . Arthritis   . MS (multiple sclerosis)   . Anxiety   . Depression     PROCEDURE: Procedure(s): SHOULDER HEMI-ARTHROPLASTY on 05/07/2011  CONSULTS:     HISTORY:  See H&P in chart  HOSPITAL COURSE:  Casey Young is a 66 y.o. admitted on 05/07/2011 and found to have a diagnosis of osteoarthritis left shoulder.  After appropriate laboratory studies were obtained  they were taken to the operating room on 05/07/2011 and underwent Procedure(s): SHOULDER HEMI-ARTHROPLASTY.   They were given perioperative antibiotics:  Anti-infectives     Start     Dose/Rate Route Frequency Ordered Stop   05/07/11 2330   vancomycin (VANCOCIN) IVPB 1000 mg/200 mL premix        1,000 mg 200 mL/hr over 60 Minutes Intravenous Every 12 hours 05/07/11 1645 05/07/11 2321   05/07/11 0915   vancomycin (VANCOCIN) IVPB 1000 mg/200 mL premix        1,000 mg 200 mL/hr over 60 Minutes Intravenous  Once 05/07/11 0908 05/07/11 1125        .  Tolerated the procedure well.  Placed with a foley intraoperatively.  Given Ofirmev at induction and for 48 hours.    POD #1, allowed out of bed to a chair.  PT for ambulation and exercise program.  Foley D/C'd in morning.  IV saline locked.  O2 discontionued.  POD #2, continued PT and ambulation .  The remainder of the hospital course was dedicated to ambulation and strengthening.   The patient was discharged on 1 Day Post-Op in  Good condition.  Blood products given:none  DIAGNOSTIC STUDIES: Recent vital signs: Patient Vitals for the past 24  hrs:  BP Temp Pulse Resp SpO2  05/08/11 0626 160/73 mmHg 97.2 F (36.2 C) 95  20  93 %  05/07/11 2103 161/73 mmHg 97 F (36.1 C) 89  20  92 %  05/07/11 1825 155/71 mmHg - 70  - -       Recent laboratory studies:  [facility] 05/08/11 0512 05/13/2011 0936  WBC 7.5 7.6  HGB 10.7* 13.5  HCT 33.7* 40.7  PLT 256 277    Basename 05/08/11 0512 May 13, 2011 0936  NA 138 140  K 4.0 3.5  CL 104 101  CO2 26 28  BUN 11 14  CREATININE 0.75 0.76  GLUCOSE 125* 94  CALCIUM 8.1* 9.5   Lab Results  Component Value Date   INR 1.12 13-May-2011     Recent Radiographic Studies :  Dg Chest 2 View  May 13, 2011  *RADIOLOGY REPORT*  Clinical Data: Preop for right shoulder arthroplasty  CHEST - 2 VIEW  Comparison: None.  Findings: The lungs are clear.  Mediastinal contours appear normal. The heart is mildly enlarged.  There is a moderate sized hiatal hernia present.  There are mild degenerative changes in the thoracic spine.  IMPRESSION: No active lung disease.  Mild cardiomegaly.  Moderate sized hiatal hernia.  Original Report Authenticated By: Juline Patch, M.D.  DISCHARGE INSTRUCTIONS: Discharge Orders    Future Orders Please Complete By Expires   Diet - low sodium heart healthy      Call MD / Call 911      Comments:   If you experience chest pain or shortness of breath, CALL 911 and be transported to the hospital emergency room.  If you develope a fever above 101 F, pus (white drainage) or increased drainage or redness at the wound, or calf pain, call your surgeon's office.   Constipation Prevention      Comments:   Drink plenty of fluids.  Prune juice may be helpful.  You may use a stool softener, such as Colace (over the counter) 100 mg twice a day.  Use MiraLax (over the counter) for constipation as needed.   Increase activity slowly as tolerated      Weight Bearing as taught in Physical Therapy      Comments:   Use a walker or crutches as instructed.   Driving restrictions      Comments:    No driving for 6 weeks   Lifting restrictions      Comments:   No lifting for 6 weeks   Patient may shower      Comments:   You may shower without a dressing once there is no drainage.  Do not wash over the wound.  If drainage remains, cover wound with plastic wrap and then shower.      DISCHARGE MEDICATIONS:   Medication List  As of 05/08/2011  5:03 PM   TAKE these medications         ALPRAZolam 0.5 MG tablet   Commonly known as: XANAX   Take 0.5 mg by mouth 3 (three) times daily.      amitriptyline 50 MG tablet   Commonly known as: ELAVIL   Take 100 mg by mouth at bedtime.      FLUoxetine 40 MG capsule   Commonly known as: PROZAC   Take 40 mg by mouth daily.      gabapentin 300 MG capsule   Commonly known as: NEURONTIN   Take 600 mg by mouth 3 (three) times daily.      meloxicam 15 MG tablet   Commonly known as: MOBIC   Take 15 mg by mouth daily.      methadone 10 MG tablet   Commonly known as: DOLOPHINE   Take 10 mg by mouth 2 (two) times daily.      methocarbamol 500 MG tablet   Commonly known as: ROBAXIN   Take 1-2 tablets (500-1,000 mg total) by mouth every 6 (six) hours as needed.      oxyCODONE 5 MG immediate release tablet   Commonly known as: Oxy IR/ROXICODONE   Take 1-2 tablets (5-10 mg total) by mouth every 3 (three) hours as needed.            FOLLOW UP VISIT:   Follow-up Information    Follow up with Raymon Mutton, MD. Call on 05/22/2011.   Contact information:   [address] [place] [address] [phone]          DISPOSITION:  Home    CONDITION:  Good   Casey Young 05/08/2011, 5:03 PM

## 2011-05-08 NOTE — Progress Notes (Signed)
CARE MANAGEMENT NOTE 05/08/2011  Patient:  Casey Young, Casey Young   Account Number:  [id number]  Date Initiated:  05/08/2011  Documentation initiated by:  Vance Peper  Subjective/Objective Assessment:   66 yr old female s/p left total shoulder arthroplasty     Action/Plan:   Spoke with patient and husband regarding HH needs. Choice offered. Information faxed to [facility]- [phone]- in [place]   Anticipated DC Date:  05/08/2011   Anticipated DC Plan:  HOME W HOME HEALTH SERVICES      DC Planning Services  CM consult      [facility] Choice  HOME HEALTH   Choice offered to / List presented to:  C-1 Patient   DME arranged  NA      DME agency  NA     HH arranged  HH-3 OT  HH-2 PT      HH agency  [facility]   Status of service:  Completed, signed off  Discharge Disposition:  HOME W HOME HEALTH SERVICES

## 2011-05-08 NOTE — Progress Notes (Signed)
Pt D/C to home. Scripts and D/C instructions given. 

## 2011-05-08 NOTE — Evaluation (Signed)
Physical Therapy Evaluation Patient Details Name: Casey Young MRN: [id number] DOB: 04-Feb-1945 Today's Date: 05/08/2011 Time: 1002-1040 PT Time Calculation (min): 38 min  PT Assessment / Plan / Recommendation Clinical Impression  Patient s/p left shoulder hemiarthroplasty presents with decreased independence with mobility due to bulky sling with precautions not to use left UE.  Has h/o one fall in past 6 months related to illness.  Educated patient and spouse on fall reduction techniques including use of cane, nonslip footwear, lighting, unobstructed pathway, sitting several seconds prior to standing and not rushing.  Feel she will benefit from HHPT for safety eval and to maximize independence with mobility in the home.    PT Assessment  All further PT needs can be met in the next venue of care (due to patient to discharge home this evening)    Follow Up Recommendations  Home health PT    Equipment Recommendations  None recommended by PT    Frequency      Precautions / Restrictions Precautions Precautions: Fall Precaution Comments: recent fall at Christmas time when ill with high fever and disoriented at night Required Braces or Orthoses: Other Brace/Splint Other Brace/Splint: shoulder sling and abd pillow Restrictions Weight Bearing Restrictions: Yes LUE Weight Bearing: Weight bearing as tolerated   Pertinent Vitals/Pain 3/10 left shoulder      Mobility  Bed Mobility Bed Mobility: Sitting - Scoot to Edge of Bed;Supine to Sit Supine to Sit: 3: Mod assist Sitting - Scoot to Edge of Bed: 4: Min guard Details for Bed Mobility Assistance: assist to scoot hips/legs out and to lift trunk Transfers Transfers: Sit to Stand;Stand to Sit Sit to Stand: 4: Min guard;From bed;From toilet;With upper extremity assist Stand to Sit: 4: Min guard;With armrests;To toilet;To chair/3-in-1 Details for Transfer Assistance: cues for safety with reaching for grabbar in bathroom and using  armrest on recliner Ambulation/Gait Ambulation/Gait Assistance: 4: Min guard Ambulation Distance (Feet): 225 Feet Assistive device: Straight cane Ambulation/Gait Assistance Details: slow pace, but steady and with erect posture initially, degraded after toileting to leaning left with heavy arm and sling; encouraged upright posture for improved balance. Gait Pattern: Step-through pattern;Decreased stride length    Exercises     PT Goals Acute Rehab PT Goals PT Goal Formulation:  (goals deferred to home health setting)  Visit Information  Last PT Received On: 05/08/11 Assistance Needed: +1    Subjective Data  Subjective: Do all the patients go home day after surgery? Patient Stated Goal: To be independent as possible   Prior Functioning  Home Living Lives With: Spouse Available Help at Discharge: Family;Available 24 hours/day Type of Home: House Home Access: Level entry Home Layout: One level Bathroom Shower/Tub: Engineer, manufacturing systems: Standard Home Adaptive Equipment: Shower chair without back;Straight cane (reports spouse placed 3 prong tip on cane) Prior Function Level of Independence: Independent with assistive device(s) (used cane only out of the house) Driving: Yes Vocation: Retired Musician: No difficulties    Cognition  Overall Cognitive Status: Appears within functional limits for tasks assessed/performed Arousal/Alertness: Awake/alert Orientation Level: Appears intact for tasks assessed Behavior During Session: [facility] for tasks performed    Extremity/Trunk Assessment Right Lower Extremity Assessment RLE ROM/Strength/Tone: [facility] for tasks assessed Left Lower Extremity Assessment LLE ROM/Strength/Tone: [facility] for tasks assessed   Balance Balance Balance Assessed: Yes Dynamic Standing Balance Dynamic Standing - Balance Support: During functional activity;No upper extremity supported Dynamic Standing - Level of Assistance: 5: Stand by  assistance Dynamic Standing - Comments: whilst reaching  to pull up briefs after toileting  End of Session PT - End of Session Equipment Utilized During Treatment: Gait belt Activity Tolerance: Patient tolerated treatment well Patient left: in chair;with call bell/phone within reach;with family/visitor present   [facility] 05/08/2011, 12:09 PM

## 2011-05-08 NOTE — Progress Notes (Signed)
Occupational Therapy Evaluation Patient Details Name: Casey Young MRN: [id number] DOB: 1945-01-30 Today's Date: 05/08/2011 Time: 2595-6387 OT Time Calculation (min): 37 min  OT Assessment / Plan / Recommendation Clinical Impression  66 yo s/p L TSA. WBAT. PROM ER30;60ABD;  90FF. Pt will benefit fromskilled Ot services to educate pt/husband on HEP, precautions and ADL retraining s/pTSA. Pt will need HH services initailly, then f/u with outpt services.    OT Assessment  Patient needs continued OT Services    Follow Up Recommendations  Home health OT    Equipment Recommendations  None recommended by OT    Frequency Min 2X/week    Precautions / Restrictions Precautions Precautions: Fall;Shoulder Type of Shoulder Precautions: WBAT. 30ER;60Abd;90FF PROM Precaution Booklet Issued: Yes (comment) Precaution Comments: recent fall at Christmas time when ill with high fever and disoriented at night Required Braces or Orthoses: Other Brace/Splint Other Brace/Splint: shoulder sling and abd pillow Restrictions Weight Bearing Restrictions: Yes LUE Weight Bearing: Weight bearing as tolerated   Pertinent Vitals/Pain See pain scale    ADL  Eating/Feeding: Simulated;Set up Where Assessed - Eating/Feeding: Chair Grooming: Performed;Moderate assistance Where Assessed - Grooming: Standing at sink Upper Body Bathing: Performed;Minimal assistance Where Assessed - Upper Body Bathing: Sitting, bed;Unsupported Lower Body Bathing: Performed;Moderate assistance Where Assessed - Lower Body Bathing: Unsupported;Sit to stand from bed Upper Body Dressing: Performed;Moderate assistance Where Assessed - Upper Body Dressing: Unsupported;Sit to stand from bed Lower Body Dressing: Performed;Moderate assistance Where Assessed - Lower Body Dressing: Sit to stand from bed;Unsupported Toilet Transfer: Performed;Minimal assistance Toilet Transfer Method: Proofreader: Comfort height  toilet Toileting - Clothing Manipulation: Minimal assistance Where Assessed - Glass blower/designer Manipulation: Standing Toileting - Hygiene: Performed;Independent Where Assessed - Toileting Hygiene: Standing Tub/Shower Transfer: Not assessed    OT Goals Acute Rehab OT Goals OT Goal Formulation: With patient Time For Goal Achievement: 05/11/11 Potential to Achieve Goals: Good ADL Goals Additional ADL Goal #1: pt/husband will demonstrate ability to complete ADL sessin adhering to shoulder precautions with min vc. ADL Goal: Additional Goal #1 - Progress: Goal set today Additional ADL Goal #2: pt/husband will demonstrate ability to doff/donn abduction sling L , with minvc. ADL Goal: Additional Goal #2 - Progress: Goal set today Arm Goals Pt Will Tolerate PROM: with caregiver independent in performing;without pain;Left upper extremity;1 set;Other (comment) (following 30,60 90 protocal.) Arm Goal: PROM - Progress: Goal set today Additional Arm Goal #1: pt/family will complete LUE pendulum ex with min vc. Arm Goal: Additional Goal #1 - Progress: Goal set today  Visit Information  Last OT Received On: 05/08/11 Assistance Needed: +1    Subjective Data      Prior Functioning  Home Living Lives With: Spouse Available Help at Discharge: Family;Available 24 hours/day Type of Home: House Home Access: Level entry Home Layout: One level Bathroom Shower/Tub: Engineer, manufacturing systems: Standard Bathroom Accessibility: Yes How Accessible: Accessible via walker Home Adaptive Equipment: Shower chair without back;Straight cane Prior Function Level of Independence: Independent with assistive device(s) Able to Take Stairs?: Yes Driving: Yes Vocation: Retired Musician: No difficulties Dominant Hand: Right    Cognition  Overall Cognitive Status: Appears within functional limits for tasks assessed/performed Arousal/Alertness: Awake/alert Orientation Level: Appears  intact for tasks assessed Behavior During Session: [facility] for tasks performed    Extremity/Trunk Assessment Right Upper Extremity Assessment RUE ROM/Strength/Tone: Within functional levels RUE Sensation: WFL - Light Touch;WFL - Proprioception RUE Coordination: WFL - gross/fine motor;WFL - gross motor Left Upper Extremity Assessment LUE  ROM/Strength/Tone: Deficits;Due to precautions LUE Sensation: WFL - Light Touch;WFL - Proprioception LUE Coordination: Deficits (due to precautions)  Trunk Assessment Trunk Assessment: Normal   Mobility Transfers Transfers: Sit to Stand;Stand to Sit Sit to Stand: 4: Min guard;From bed;From toilet;With upper extremity assist Stand to Sit: 4: Min guard;With armrests;To toilet;To chair/3-in-1 Details for Transfer Assistance: cues for safety with reaching for grabbar in bathroom and using armrest on recliner   Exercise   Balance   End of Session OT - End of Session Equipment Utilized During Treatment: Other (comment) (abduction sling) Activity Tolerance: Patient limited by pain;Patient tolerated treatment well Patient left: in chair;with call bell/phone within reach;with family/visitor present Nurse Communication: Weight bearing status;Mobility status   Miria Cappelli,[name] 05/08/2011, 12:37 PM [facility], OTR/L  [phone] 05/08/2011

## 2011-05-08 NOTE — Progress Notes (Signed)
Occupational Therapy Treatment Patient Details Name: Casey Young MRN: [id number] DOB: Sep 02, 1945 Today's Date: 05/08/2011 Time: 6295-2841 OT Time Calculation (min): 40 min  OT Assessment / Plan / Recommendation Comments on Treatment Session Excellent participation with therapy. All education completed. All further OT to be completed by HHOT. Pt will need to f/u with oupt OT.     Follow Up Recommendations  Home health OT    Equipment Recommendations  None recommended by OT    Frequency Min 2X/week   Plan Discharge plan remains appropriate    Precautions / Restrictions Precautions Precautions: Fall;Shoulder Type of Shoulder Precautions: WBAT. 30ER;60Abd;90FF PROM Precaution Booklet Issued: Yes (comment) Precaution Comments: recent fall at Christmas time when ill with high fever and disoriented at night Required Braces or Orthoses: Other Brace/Splint Other Brace/Splint: shoulder sling and abd pillow Restrictions Weight Bearing Restrictions: Yes LUE Weight Bearing: Weight bearing as tolerated   Pertinent Vitals/Pain     ADL  Eating/Feeding: Simulated;Set up Where Assessed - Eating/Feeding: Chair Grooming: Performed;Moderate assistance Where Assessed - Grooming: Standing at sink Upper Body Bathing: Performed;Minimal assistance Where Assessed - Upper Body Bathing: Sitting, bed;Unsupported Lower Body Bathing: Performed;Moderate assistance Where Assessed - Lower Body Bathing: Unsupported;Sit to stand from bed Upper Body Dressing: Performed;Moderate assistance Where Assessed - Upper Body Dressing: Unsupported;Sit to stand from bed Lower Body Dressing: Performed;Moderate assistance Where Assessed - Lower Body Dressing: Sit to stand from bed;Unsupported Toilet Transfer: Performed;Minimal assistance Toilet Transfer Method: Proofreader: Comfort height toilet Toileting - Clothing Manipulation: Minimal assistance Where Assessed - Glass blower/designer  Manipulation: Standing Toileting - Hygiene: Performed;Independent Where Assessed - Toileting Hygiene: Standing Tub/Shower Transfer: Not assessed Ambulation Related to ADLs: HHA ADL Comments: Pt's husband educated on proper handling techniques and precuaitons to follow during ADL session. Pt's husband able to complete with minvc. Pt ble to direct husband regarding precautions, including not to try to pull on her L arm. Given written information on all education and exercises..    OT Goals Acute Rehab OT Goals OT Goal Formulation: With patient Time For Goal Achievement: 05/11/11 Potential to Achieve Goals: Good ADL Goals Additional ADL Goal #1: pt/husband will demonstrate ability to complete ADL sessin adhering to shoulder precautions with min vc. ADL Goal: Additional Goal #1 - Progress: Met Additional ADL Goal #2: pt/husband will demonstrate ability to doff/donn abduction sling L , with minvc. ADL Goal: Additional Goal #2 - Progress: Met Arm Goals Pt Will Tolerate PROM: with caregiver independent in performing;without pain;Left upper extremity;1 set;Other (comment) Arm Goal: PROM - Progress: Met Additional Arm Goal #1: pt/family will complete LUE pendulum ex with min vc. Arm Goal: Additional Goal #1 - Progress: Met  Visit Information  Last OT Received On: 05/08/11 Assistance Needed: +1    Subjective Data      Prior Functioning  Home Living Lives With: Spouse Available Help at Discharge: Family;Available 24 hours/day Type of Home: House Home Access: Level entry Home Layout: One level Bathroom Shower/Tub: Engineer, manufacturing systems: Standard Bathroom Accessibility: Yes How Accessible: Accessible via walker Home Adaptive Equipment: Shower chair without back;Straight cane Prior Function Level of Independence: Independent with assistive device(s) Able to Take Stairs?: Yes Driving: Yes Vocation: Retired Musician: No difficulties Dominant Hand: Right      Cognition  Overall Cognitive Status: Appears within functional limits for tasks assessed/performed Arousal/Alertness: Awake/alert Orientation Level: Appears intact for tasks assessed Behavior During Session: [facility] for tasks performed    Mobility Bed Mobility Bed Mobility: Sitting - Scoot to  Edge of Bed;Supine to Sit Supine to Sit: 3: Mod assist Sitting - Scoot to Edge of Bed: 4: Min guard Details for Bed Mobility Assistance: assist to scoot hips/legs out and to lift trunk Transfers Transfers: Sit to Stand;Stand to Sit Sit to Stand: 4: Min guard Stand to Sit: 4: Min guard;With armrests;To toilet;To chair/3-in-1 Details for Transfer Assistance: cues for safety with reaching for grabbar in bathroom and using armrest on recliner   Exercises Shoulder Exercises Pendulum Exercise: PROM;Left;10 reps;Standing Shoulder Flexion: PROM;Left;10 reps;Other (comment) (limitation up to 90. Pt able to achieve @ 30.) Shoulder External Rotation: PROM;Left;10 reps;Seated;Other (comment) (limitation to 30. Pt able to achieve @ 15.) Elbow Flexion: AAROM;10 reps;Left;Seated Elbow Extension: AAROM;Left;10 reps;Seated Wrist Flexion: AROM;Left;10 reps;Seated Wrist Extension: AROM;Left;10 reps;Seated Digit Composite Flexion: AROM;Left;10 reps;Seated Composite Extension: AROM;Left;10 reps;Seated  Balance   End of Session OT - End of Session Equipment Utilized During Treatment: Other (comment) Activity Tolerance: Patient limited by pain;Patient tolerated treatment well Patient left: in chair;with call bell/phone within reach;with family/visitor present Nurse Communication: Weight bearing status;Mobility status   Casey Young,Casey Young 05/08/2011, 12:45 PM [facility], OTR/L  [phone] 05/08/2011

## 2011-05-08 NOTE — Discharge Instructions (Signed)
Home Health to be provided by [facility]- [phone]  Diet: As you were doing prior to hospitalization   Activity:  Increase activity slowly as tolerated                  No lifting or driving for 6 weeks  Shower:  May shower without a dressing once there is no drainage from your wound.                 Do NOT wash over the wound.  If drainage remains, cover wound with saran                  Wrap and then shower.  Clean incision with betadine and change dressing                        After saran wrap removed.  Dressing:  You may change your dressing on Wednesday                    Then change the dressing daily with sterile 4"x4"s gauze dressing                     And TED hose for knees.  Use paper tape to hold dressing in place                     For hips.  You may clean the incision with alcohol prior to redressing.  Weight Bearing:  Weight bearing as tolerated as taught in physical therapy.  Use a                                walker or Crutches as instructed.  To prevent constipation: you may use a stool softener such as -               Colace ( over the counter) 100 mg by mouth twice a day                Drink plenty of fluids ( prune juice may be helpful) and high fiber foods                Miralax ( over the counter) for constipation as needed.    Precautions:  If you experience chest pain or shortness of breath - call 911 immediately               For transfer to the hospital emergency department!!               If you develop a fever greater that 101 F, purulent drainage from wound,                             increased redness or drainage from wound, or calf pain -- Call the office at                                                 [phone].  Follow- Up Appointment:  Please call for an appointment to be seen on 05/22/11  [place] - [phone]                   

## 2011-05-08 NOTE — H&P (Signed)
Addendum.  H&P from 05/07/11 states knee pain 10 out of 10 and it should be left shoulder pain 10 out of 10.

## 2011-05-10 NOTE — Op Note (Signed)
Dictation [phone]

## 2011-05-13 NOTE — Op Note (Signed)
NAMEMATTISEN, POHLMANN              ACCOUNT NO.:  [id number]  MEDICAL RECORD NO.:  [id number]  LOCATION:                                 FACILITY:  PHYSICIAN:  Mila Homer. Sherlean Foot, M.D. 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  PROCEDURE:  Left shoulder hemiarthroplasty.  INDICATION FOR PROCEDURE:  The patient is a 65 year old with pain, now greater than 6 months status post a proximal humerus fracture.  Informed consent was obtained for hemiarthroplasty.  She was having lot of mechanical symptoms, pain, and inability to perform ADLs.  DESCRIPTION OF THE PROCEDURE:  The patient was laid in supine and administered general anesthesia.  The left shoulder was prepped and draped in the usual sterile fashion with the patient in the beach-chair position.  Sterilely prepped and draped the shoulder.  I made an anterior incision from the coracoid process down to the insertion of the biceps, approximately 6 inches in length.  I used cautery dissection after making an incision with #10 blade.  I dissected down to the biceps interval where I found the vein, kept that lateral and went down to the humerus.  I retracted the deltoid laterally at the deltopectoral interval and the strap muscles medially.  I then reflected the subscapularis off making an incision with the Bovie just medial to the bicipital groove.  I tagged these with #2 Tevdek sutures.  I exposed the capsule and the joint, and the old healed fracture with impaction.  I removed the anterior joint capsule.  This afforded excellent visualization of the glenoid with just some grade 3 chondromalacia in the depths of the glenoid, otherwise was normal.  Normal labrum,  biceps attachment, etc.  I elected to do a hemiarthroplasty.  I externally rotated the arm, delivering the humeral head up out of the wound.  I then gained access to the femoral canal and hand reamed up to 10.  I then placed on the cutter to make sure I was in 30 degrees inversion and made that cut removing the head and chose an 11 x 38-mm head, put that on eccentric head on the stem, and a good tension and version.  I then removed it and lavaged, I closed the rotator cuff with free needles and modified Mason-Allen sutures in the deep soft tissue buried 0, subcuticular 2-0 and Steri-Strips, dressed with Xeroform  dressing sponge near shoulder dressing and sling and swath.  COMPLICATIONS:  None.  DRAINS:  None.          ______________________________ Mila Homer. Sherlean Foot, M.D.     SDL/MEDQ  D:  05/10/2011  T:  05/11/2011  Job:  [id number]

## 2012-03-27 DIAGNOSIS — M549 Dorsalgia, unspecified: Secondary | ICD-10-CM | POA: Insufficient documentation

## 2012-06-11 DIAGNOSIS — G35 Multiple sclerosis: Secondary | ICD-10-CM | POA: Insufficient documentation

## 2012-06-11 DIAGNOSIS — M79604 Pain in right leg: Secondary | ICD-10-CM | POA: Insufficient documentation

## 2012-06-11 DIAGNOSIS — I10 Essential (primary) hypertension: Secondary | ICD-10-CM | POA: Insufficient documentation

## 2012-06-11 DIAGNOSIS — G4700 Insomnia, unspecified: Secondary | ICD-10-CM | POA: Insufficient documentation

## 2012-06-11 DIAGNOSIS — J189 Pneumonia, unspecified organism: Secondary | ICD-10-CM | POA: Insufficient documentation

## 2012-06-11 DIAGNOSIS — M4310 Spondylolisthesis, site unspecified: Secondary | ICD-10-CM | POA: Insufficient documentation

## 2012-06-11 DIAGNOSIS — M48061 Spinal stenosis, lumbar region without neurogenic claudication: Secondary | ICD-10-CM | POA: Insufficient documentation

## 2012-06-11 DIAGNOSIS — J302 Other seasonal allergic rhinitis: Secondary | ICD-10-CM | POA: Insufficient documentation

## 2012-11-08 IMAGING — CR DG CHEST 2V
2 series · 2 of 2 positions shown · non-contrast
Comparison: None.

CLINICAL DATA: Preop for right shoulder arthroplasty

CHEST - 2 VIEW

[view not recorded (1 of 2)]
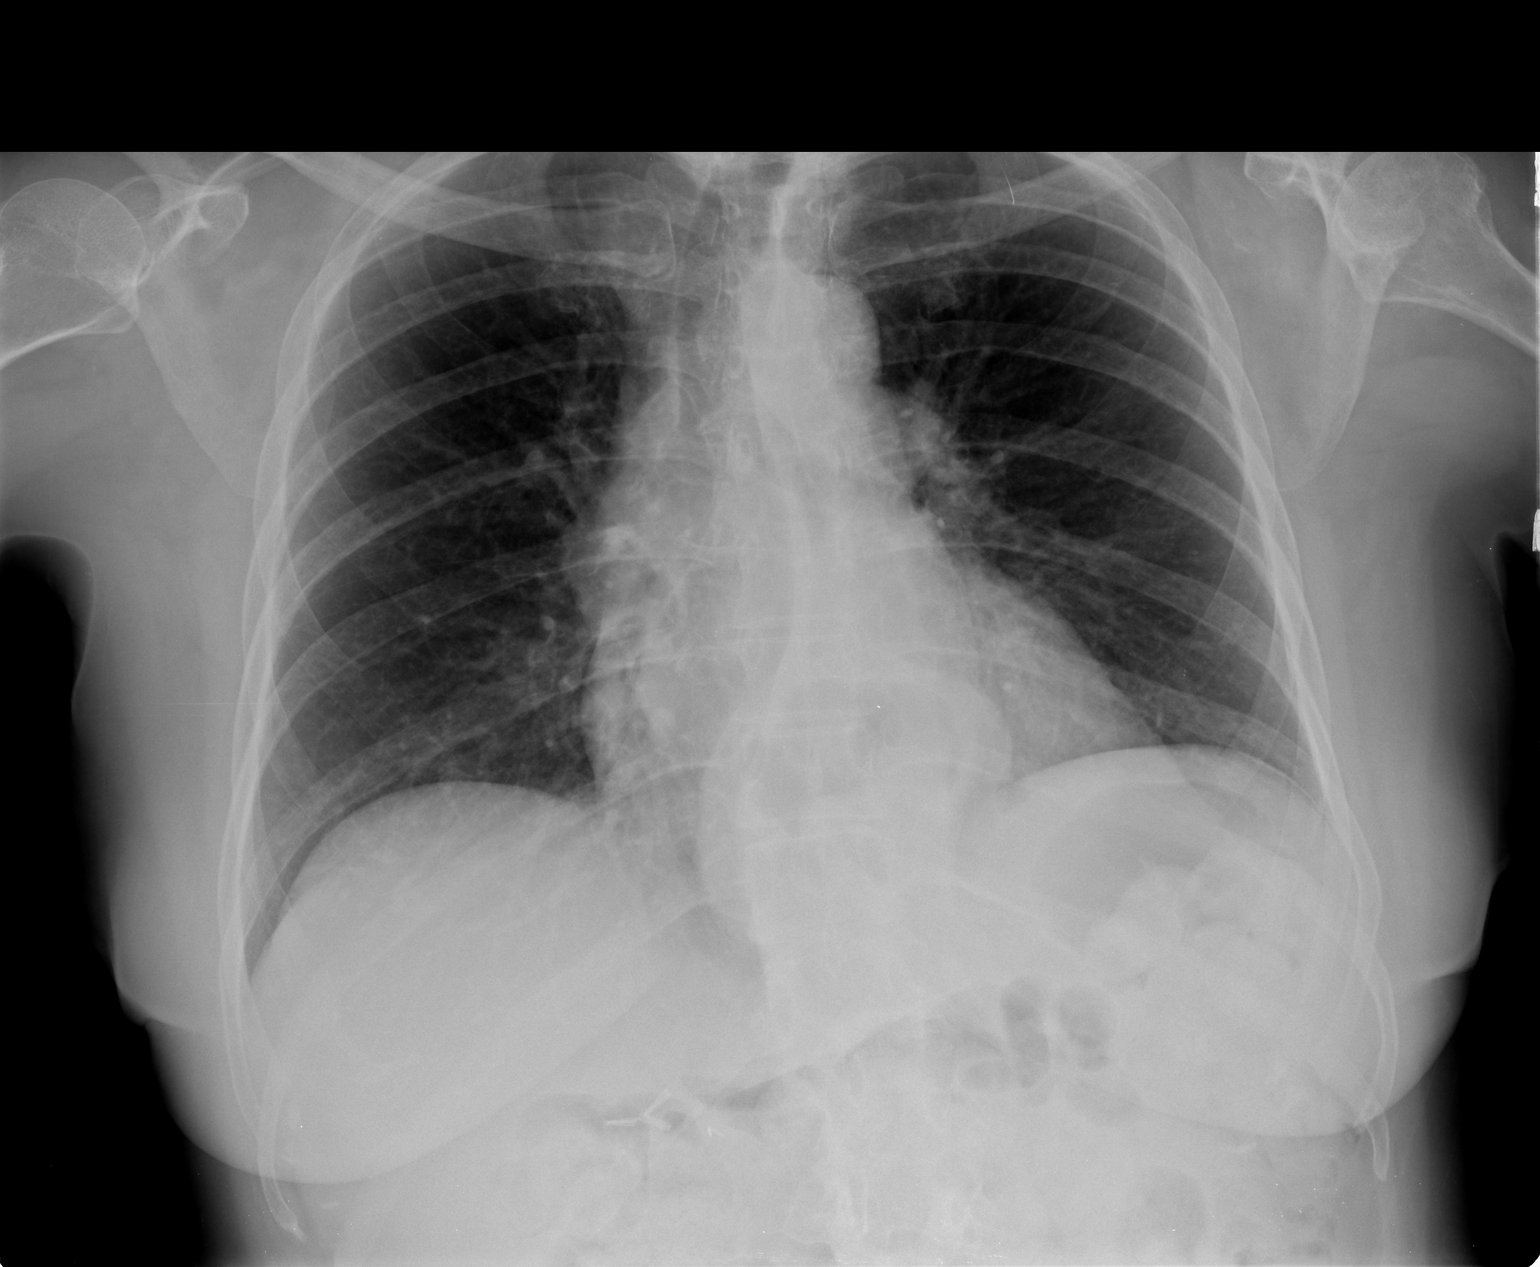

[view not recorded (2 of 2)]
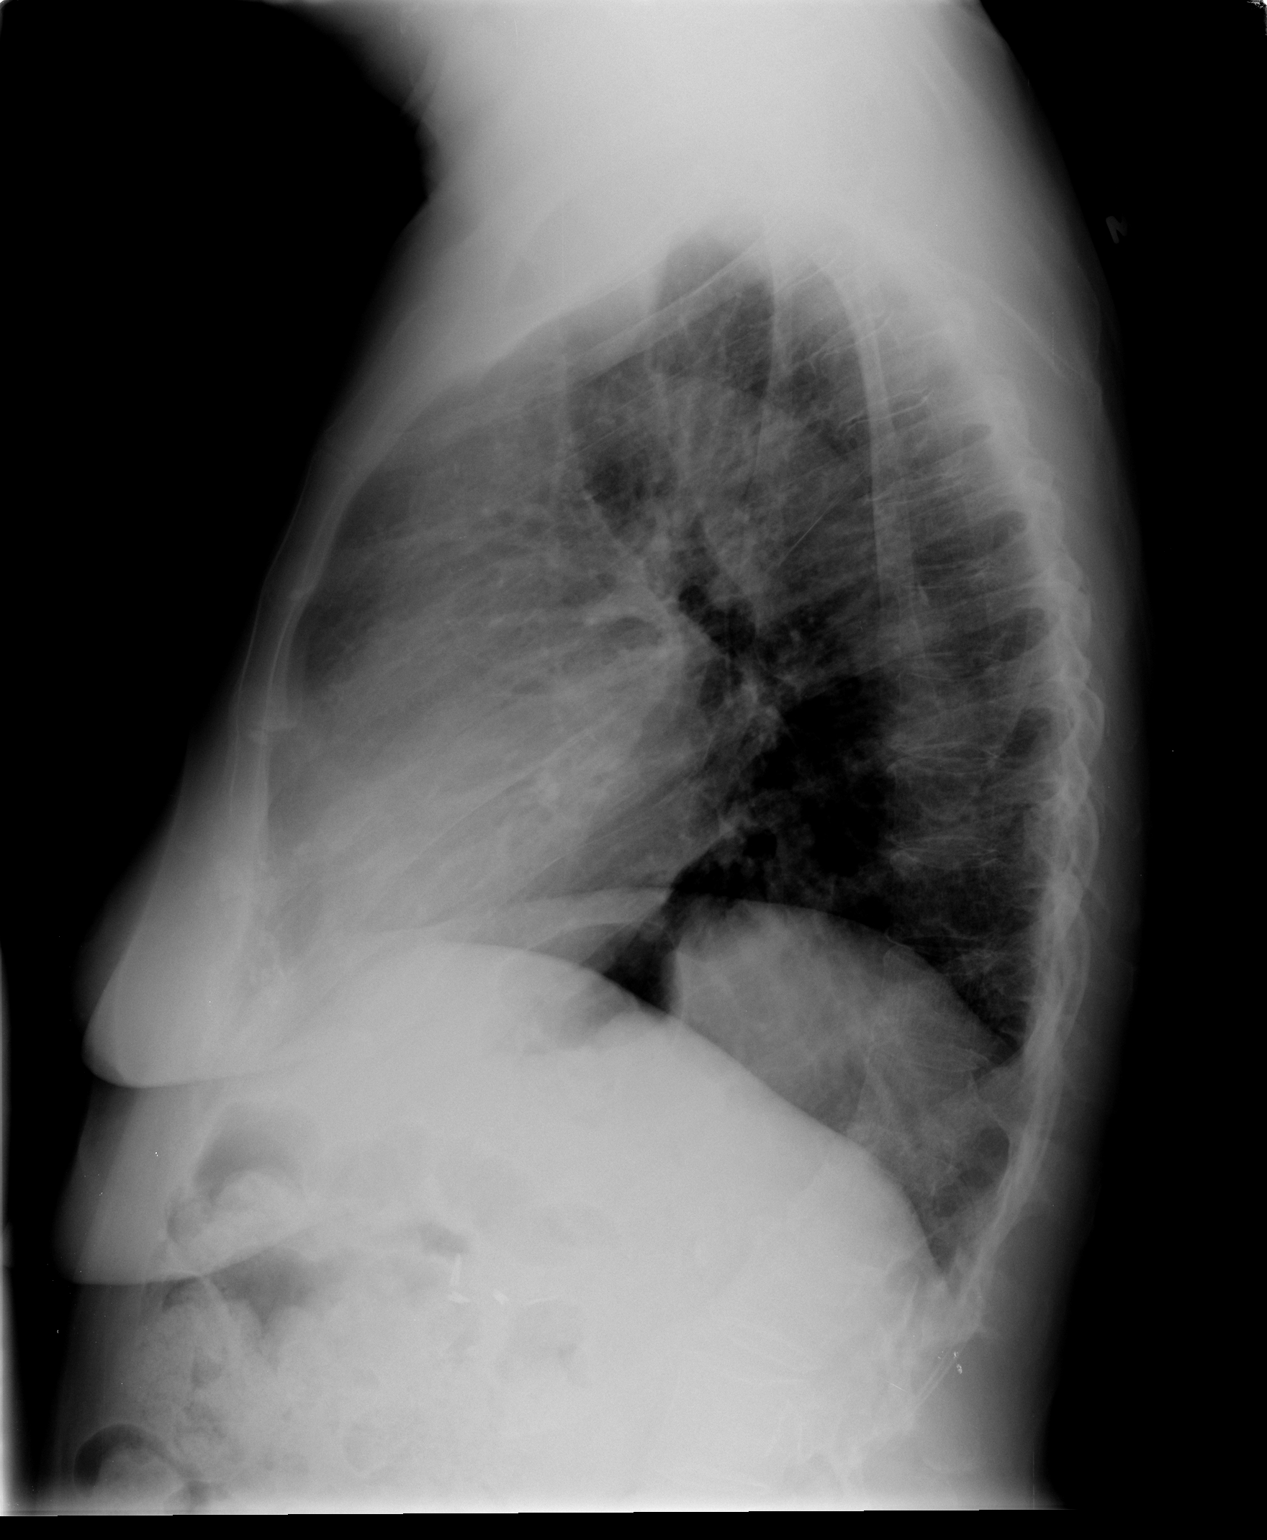

[2 of 2 positions shown; findings below may reference images not displayed]

FINDINGS: The lungs are clear.  Mediastinal contours appear normal.
The heart is mildly enlarged.  There is a moderate sized hiatal
hernia present.  There are mild degenerative changes in the
thoracic spine.
IMPRESSION: No active lung disease.  Mild cardiomegaly.  Moderate sized hiatal
hernia.

## 2013-12-03 ENCOUNTER — Encounter: Payer: Self-pay | Admitting: Neurology

## 2013-12-03 ENCOUNTER — Ambulatory Visit (INDEPENDENT_AMBULATORY_CARE_PROVIDER_SITE_OTHER): Payer: Medicare PPO | Admitting: Neurology

## 2013-12-03 VITALS — BP 130/78 | HR 85 | Temp 97.0°F | Resp 14 | Ht 60.0 in | Wt 151.2 lb

## 2013-12-03 DIAGNOSIS — Z5181 Encounter for therapeutic drug level monitoring: Secondary | ICD-10-CM

## 2013-12-03 DIAGNOSIS — G35 Multiple sclerosis: Secondary | ICD-10-CM

## 2013-12-03 DIAGNOSIS — R269 Unspecified abnormalities of gait and mobility: Secondary | ICD-10-CM

## 2013-12-03 DIAGNOSIS — R413 Other amnesia: Secondary | ICD-10-CM

## 2013-12-03 HISTORY — DX: Unspecified abnormalities of gait and mobility: R26.9

## 2013-12-03 HISTORY — DX: Other amnesia: R41.3

## 2013-12-03 NOTE — Patient Instructions (Signed)

## 2013-12-03 NOTE — Progress Notes (Signed)
Reason for visit: Multiple sclerosis  Casey Young is a 68 y.o. female  History of present illness:  Casey Young is a 68 year old right-handed white female with a history of multiple sclerosis that was diagnosed initially 54. At that time, she presented with weakness involving the right arm and the right leg associated with numbness and tingling sensations on that side as well. The patient also developed decreased visual acuity involving the right eye. MRI evaluation showed multiple white matter lesions, and lumbar puncture was done to confirm the diagnosis. The patient has been placed on Betaseron, and treated for her multiple sclerosis by Dr. Vergia Alcon. The patient has continued to have her right-sided symptoms, and she has developed some cognitive issues as well and she has a chronic gait disorder. The patient has fallen on several occasions, with a tendency to go backwards. The patient is using a walker, and she has used a walker for least 10 years. She indicates that she still has some exacerbations of her MS, the last occurring one month ago with a sudden change in numbness and gait instability. The patient went off of her Betaseron 3 years ago, she did this on her own, and she does not recall why she stopped the drug. The patient reports troubles controlling the bladder with chronic incontinence. She has not had any physical therapy recently. She comes to this office for an evaluation.  Past Medical History  Diagnosis Date  . Pneumonia     over 5 yrs ago  . Arthritis   . MS (multiple sclerosis)   . Anxiety   . Depression   . Hypertension   . Diverticulosis   . Asthma   . COPD (chronic obstructive pulmonary disease)   . Chronic pain syndrome   . Degenerative arthritis   . Memory deficits 12/03/2013  . Abnormality of gait 12/03/2013    Past Surgical History  Procedure Laterality Date  . Tonsillectomy    . Cholecystectomy    . Appendectomy    . Abdominal  hysterectomy    . Back surgery      lumbar   x 3 surgeries  . Shoulder hemi-arthroplasty  05/07/2011    Procedure: SHOULDER HEMI-ARTHROPLASTY;  Surgeon: Raymon Mutton, MD;  Location: [facility] OR;  Service: Orthopedics;  Laterality: Left;  . Tubal ligation Bilateral   . Cataract extraction Bilateral     Family History  Problem Relation Age of Onset  . Stroke Mother   . Heart attack Father   . Heart failure Sister   . Diabetes Brother   . Heart disease Brother     Social history:  reports that she has never smoked. She does not have any smokeless tobacco history on file. She reports that she does not drink alcohol or use illicit drugs.  Medications:  Current Outpatient Prescriptions on File Prior to Visit  Medication Sig Dispense Refill  . ALPRAZolam (XANAX) 0.5 MG tablet Take 0.5 mg by mouth 3 (three) times daily.    Marland Kitchen amitriptyline (ELAVIL) 50 MG tablet Take 100 mg by mouth at bedtime.    Marland Kitchen FLUoxetine (PROZAC) 40 MG capsule Take 40 mg by mouth daily.    Marland Kitchen gabapentin (NEURONTIN) 300 MG capsule Take 600 mg by mouth 2 (two) times daily.     . methadone (DOLOPHINE) 10 MG tablet Take 10 mg by mouth 2 (two) times daily.     No current facility-administered medications on file prior to visit.      Allergies  Allergen Reactions  . Penicillins Rash    ROS:  Out of a complete 14 system review of symptoms, the patient complains only of the following symptoms, and all other reviewed systems are negative.  Fatigue Blurred vision Urinary incontinence Memory loss, confusion  Blood pressure 130/78, pulse 85, temperature 97 F (36.1 C), temperature source Oral, resp. rate 14, height 5' (1.524 m), weight 151 lb 3.2 oz (68.584 kg).  Physical Exam  General: The patient is alert and cooperative at the time of the examination.  Eyes: Pupils are equal, round, and reactive to light. Discs are flat bilaterally.  Neck: The neck is supple, no carotid bruits are noted.  Respiratory: The  respiratory examination is clear.  Cardiovascular: The cardiovascular examination reveals a regular rate and rhythm, no obvious murmurs or rubs are noted.  Skin: Extremities are without significant edema.  Neurologic Exam  Mental status: The patient is alert and cooperative, slow cognitively.  Cranial nerves: Facial symmetry is present. There is good sensation of the face to pinprick and soft touch bilaterally. The strength of the facial muscles and the muscles to head turning and shoulder shrug are normal bilaterally. Speech is well enunciated, no aphasia or dysarthria is noted. Extraocular movements are full. Visual fields are full. The tongue is midline, and the patient has symmetric elevation of the soft palate. No obvious hearing deficits are noted.  Motor: The motor testing reveals 5 over 5 strength of all 4 extremities. Good symmetric motor tone is noted throughout.  Sensory: Sensory testing is intact to pinprick, soft touch, vibration sensation, and position sense on the left extremities. On the right, there is decreased pinprick on the arm and leg, and decreased vibration sensation and position sense on the foot. No evidence of extinction is noted.  Coordination: Cerebellar testing reveals good finger-nose-finger and heel-to-shin bilaterally. The patient leans to the left with sitting.  Gait and station: Gait is wide based and unsteady, the patient uses a walker. Tandem gait was not tested. Romberg is positive, the patient goes backwards. No drift is seen.  Reflexes: Deep tendon reflexes are symmetric and normal bilaterally. Toes are downgoing bilaterally.   Assessment/Plan:  1. Multiple sclerosis  2. Gait disorder  3. Memory disorder   The patient clearly has significant dysfunction associated with the multiple sclerosis. The patient has significant gait instability, with a tendency to lean backwards. She has not been on any disease modifying agents for 3 years. The patient  is interested in going on Aubagio. The patient will have blood work done today, and she will be set up for MRI evaluation with and without gadolinium enhancement of the brain and cervical spinal cord. The patient will follow-up in 3 months. I will set the patient up for physical therapy. The patient will need to have liver function tests done monthly for 6 months while on the medication.   Marlan Palau. Keith Willis MD 12/03/2013 12:23 PM  [facility] [address] [place], [place] [id number]  Phone [phone] Fax [phone]

## 2013-12-04 LAB — COMPREHENSIVE METABOLIC PANEL
ALK PHOS: 108 IU/L (ref 39–117)
ALT: 21 IU/L (ref 0–32)
AST: 27 IU/L (ref 0–40)
Albumin/Globulin Ratio: 1.8 (ref 1.1–2.5)
Albumin: 4.5 g/dL (ref 3.6–4.8)
BUN / CREAT RATIO: 18 (ref 11–26)
BUN: 16 mg/dL (ref 8–27)
CALCIUM: 9.8 mg/dL (ref 8.7–10.3)
CHLORIDE: 100 mmol/L (ref 97–108)
CO2: 24 mmol/L (ref 18–29)
Creatinine, Ser: 0.88 mg/dL (ref 0.57–1.00)
GFR calc Af Amer: 78 mL/min/{1.73_m2} (ref >59)
GFR calc non Af Amer: 67 mL/min/{1.73_m2} (ref >59)
GLOBULIN, TOTAL: 2.5 g/dL (ref 1.5–4.5)
Glucose: 141 mg/dL — ABNORMAL HIGH (ref 65–99)
Potassium: 4 mmol/L (ref 3.5–5.2)
SODIUM: 143 mmol/L (ref 134–144)
Total Bilirubin: 0.6 mg/dL (ref 0.0–1.2)
Total Protein: 7 g/dL (ref 6.0–8.5)

## 2013-12-04 LAB — CBC WITH DIFFERENTIAL
BASOS: 2 %
Basophils Absolute: 0.1 10*3/uL (ref 0.0–0.2)
EOS: 3 %
Eosinophils Absolute: 0.2 10*3/uL (ref 0.0–0.4)
HCT: 43.5 % (ref 34.0–46.6)
HEMOGLOBIN: 14.4 g/dL (ref 11.1–15.9)
IMMATURE GRANULOCYTES: 0 %
Immature Grans (Abs): 0 10*3/uL (ref 0.0–0.1)
LYMPHS: 16 %
Lymphocytes Absolute: 1 10*3/uL (ref 0.7–3.1)
MCH: 27.9 pg (ref 26.6–33.0)
MCHC: 33.1 g/dL (ref 31.5–35.7)
MCV: 84 fL (ref 79–97)
MONOCYTES: 9 %
MONOS ABS: 0.5 10*3/uL (ref 0.1–0.9)
Neutrophils Absolute: 4.3 10*3/uL (ref 1.4–7.0)
Neutrophils Relative %: 70 %
PLATELETS: 327 10*3/uL (ref 150–379)
RBC: 5.17 x10E6/uL (ref 3.77–5.28)
RDW: 13.8 % (ref 12.3–15.4)
WBC: 6.2 10*3/uL (ref 3.4–10.8)

## 2013-12-21 ENCOUNTER — Telehealth: Payer: Self-pay | Admitting: Neurology

## 2013-12-21 NOTE — Telephone Encounter (Signed)
Pt states that her arms and legs are shaking because of her MS. She wants to know what she can take for this. She states she can't do anything because of this.  She states she needs something now. She does not see any results from the physical therapy.  Please call and advise Please call [phone].

## 2013-12-21 NOTE — Telephone Encounter (Signed)
I called the patient. The patient indicates that she is having some titubations and problems with coordination, I'm not sure that medications will help this. The patient has not yet gotten on Aubagio. She indicates that she did talk to one of the representatives from the company, they indicated that they did not yet receive a prescription, but the original form that the patient signed functions as a prescription. I will try to find out what is going on.

## 2013-12-22 NOTE — Telephone Encounter (Signed)
I called Aubagio.  Spoke with Asher MuirJamie, who transferred me to Angelique.  She verified the enrollment form that was sent is considered the Rx.  Says they are still conducting their benefit investigation, and nothing furter is needed from us, as we have already provided everything.  They are trying to determine what options they may be able to offer the patient to aide with cost of meds.

## 2013-12-31 ENCOUNTER — Encounter: Payer: Self-pay | Admitting: Neurology

## 2014-03-10 ENCOUNTER — Ambulatory Visit (INDEPENDENT_AMBULATORY_CARE_PROVIDER_SITE_OTHER): Payer: Medicare PPO | Admitting: Adult Health

## 2014-03-10 ENCOUNTER — Encounter: Payer: Self-pay | Admitting: Adult Health

## 2014-03-10 VITALS — BP 145/82 | HR 72 | Ht 65.0 in | Wt 140.0 lb

## 2014-03-10 DIAGNOSIS — G35 Multiple sclerosis: Secondary | ICD-10-CM

## 2014-03-10 DIAGNOSIS — R269 Unspecified abnormalities of gait and mobility: Secondary | ICD-10-CM

## 2014-03-10 NOTE — Patient Instructions (Signed)
Continue Aubagio.  I will look over blood work from PCP. If your symptoms worsen or you develop new symptoms please let us know.

## 2014-03-10 NOTE — Progress Notes (Signed)
I have read the note, and I agree with the clinical assessment and plan.  Casey Young,Casey Young   

## 2014-03-10 NOTE — Progress Notes (Signed)
PATIENT: Casey Young DOB: 20-Jul-1945  REASON FOR VISIT: follow up- Multiple sclerosis HISTORY FROM: patient  HISTORY OF PRESENT ILLNESS: Casey Young is a 69 year old female with a history multiple sclerosis. She returns today for follow-up. She was given a prescription to start Aubagio after not being on disease modifying mediation for 3 years. She reports that she is doing well with this. Her husband feels that it has improved her strength. Dr. Anne Hahn also ordered an MRI of the Brain and cervical spine the patient has not had these done yet. 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 She uses a walker to ambulate. She came today in a wheelchair just due to the distance. She reports she has had 7-8 falls since the last visit. Fortunately she has not suffered any injuries besides bruising. She is in physical therapy 2 times a week.  She does have bladder and bowel incontinence intermittently. Patient continues to have weakness on the right side. No new weakness or numbness. Patient states that she recently blood work with her PCP and that was suppose to be faxed to our office.   HISTORY 12/03/13 (WILLIS): Casey Young is a 69 year old right-handed white female with a history of multiple sclerosis that was diagnosed initially 61. At that time, she presented with weakness involving the right arm and the right leg associated with numbness and tingling sensations on that side as well. The patient also developed decreased visual acuity involving the right eye. MRI evaluation showed multiple white matter lesions, and lumbar puncture was done to confirm the diagnosis. The patient has been placed on Betaseron, and treated for her multiple sclerosis by Dr. Vergia Alcon. The patient has continued to have her right-sided symptoms, and she has developed some cognitive issues as well and she has a chronic gait disorder. The patient has fallen on several occasions, with a tendency to go backwards. The  patient is using a walker, and she has used a walker for least 10 years. She indicates that she still has some exacerbations of her MS, the last occurring one month ago with a sudden change in numbness and gait instability. The patient went off of her Betaseron 3 years ago, she did this on her own, and she does not recall why she stopped the drug. The patient reports troubles controlling the bladder with chronic incontinence. She has not had any physical therapy recently. She comes to this office for an evaluation.  REVIEW OF SYSTEMS: Out of a complete 14 system review of symptoms, the patient complains only of the following symptoms, and all other reviewed systems are negative.  Appetite change, fatigue, eye pain, not nausea, daytime sleepiness, snoring, sleep talking, difficulty urinating, painful urination, incontinence of bladder, urgency, joint pain, back pain, walking difficulty, memory loss, headache, numbness, weakness, tremors, confusion, depression, nervous/anxious, bruise easily  ALLERGIES: Allergies  Allergen Reactions  . Penicillins Rash    HOME MEDICATIONS: Outpatient Prescriptions Prior to Visit  Medication Sig Dispense Refill  . albuterol (PROAIR HFA) 108 (90 BASE) MCG/ACT inhaler Inhale into the lungs.    . ALPRAZolam (XANAX) 0.5 MG tablet Take 0.5 mg by mouth 3 (three) times daily.    Marland Kitchen amitriptyline (ELAVIL) 50 MG tablet Take 100 mg by mouth at bedtime.    Marland Kitchen amLODipine (NORVASC) 5 MG tablet Take 5 mg by mouth daily.    . cyanocobalamin 500 MCG tablet Take by mouth.    . donepezil (ARICEPT) 5 MG tablet Take 5 mg by mouth  at bedtime.    Marland Kitchen FLUoxetine (PROZAC) 40 MG capsule Take 40 mg by mouth daily.    Marland Kitchen gabapentin (NEURONTIN) 300 MG capsule Take 600 mg by mouth 2 (two) times daily.     . methadone (DOLOPHINE) 10 MG tablet Take 10 mg by mouth 2 (two) times daily.    . naproxen (NAPROSYN) 500 MG tablet Take 500 mg by mouth 2 (two) times daily with a meal.    . potassium  chloride SA (K-DUR,KLOR-CON) 20 MEQ tablet Take 20 mEq by mouth daily.     No facility-administered medications prior to visit.    PAST MEDICAL HISTORY: Past Medical History  Diagnosis Date  . Pneumonia     over 5 yrs ago  . Arthritis   . MS (multiple sclerosis)   . Anxiety   . Depression   . Hypertension   . Diverticulosis   . Asthma   . COPD (chronic obstructive pulmonary disease)   . Chronic pain syndrome   . Degenerative arthritis   . Memory deficits 12/03/2013  . Abnormality of gait 12/03/2013    PAST SURGICAL HISTORY: Past Surgical History  Procedure Laterality Date  . Tonsillectomy    . Cholecystectomy    . Appendectomy    . Abdominal hysterectomy    . Back surgery      lumbar   x 3 surgeries  . Shoulder hemi-arthroplasty  05/07/2011    Procedure: SHOULDER HEMI-ARTHROPLASTY;  Surgeon: Raymon Mutton, MD;  Location: [facility] OR;  Service: Orthopedics;  Laterality: Left;  . Tubal ligation Bilateral   . Cataract extraction Bilateral     FAMILY HISTORY: Family History  Problem Relation Age of Onset  . Stroke Mother   . Heart attack Father   . Heart failure Sister   . Diabetes Brother   . Heart disease Brother     PHYSICAL EXAM  Filed Vitals:   03/10/14 1017  BP: 145/82  Pulse: 72  Height: 5\' 5"  (1.651 m)  Weight: 140 lb (63.504 kg)   Body mass index is 23.3 kg/(m^2).  Generalized: Well developed, in no acute distress   Neurological examination  Mentation: Alert oriented to time, place, history taking. Follows all commands speech and language fluent Cranial nerve II-XII: Pupils were equal round reactive to light. Extraocular movements were full, visual field were full on confrontational test. Facial sensation and strength were normal. Uvula tongue midline. Head turning and shoulder shrug  were normal and symmetric. Motor: The motor testing reveals 5 over 5 strength of all 4 extremities except 4/5 strength in the right lower extremity.Peri Jefferson symmetric motor  tone is noted throughout.  Sensory: Sensory testing is intact to soft touch on all 4 extremities. No evidence of extinction is noted.  Coordination: Cerebellar testing reveals good finger-nose-finger and heel-to-shin bilaterally.  Gait and station: Patient is in a wheelchair today. She did not bring her walker with her. We did not attempt to stand.  Reflexes: Deep tendon reflexes are symmetric and normal bilaterally.    DIAGNOSTIC DATA (LABS, IMAGING, TESTING) - I reviewed patient records, labs, notes, testing and imaging myself where available.  Lab Results  Component Value Date   WBC 6.2 12/03/2013   HGB 14.4 12/03/2013   HCT 43.5 12/03/2013   MCV 84 12/03/2013   PLT 327 12/03/2013      Component Value Date/Time   NA 143 12/03/2013 1018   NA 138 05/08/2011 0512   K 4.0 12/03/2013 1018   CL 100 12/03/2013 1018  CO2 24 12/03/2013 1018   GLUCOSE 141* 12/03/2013 1018   GLUCOSE 125* 05/08/2011 0512   BUN 16 12/03/2013 1018   BUN 11 05/08/2011 0512   CREATININE 0.88 12/03/2013 1018   CALCIUM 9.8 12/03/2013 1018   PROT 7.0 12/03/2013 1018   PROT 7.5 05/03/2011 0936   ALBUMIN 4.1 05/03/2011 0936   AST 27 12/03/2013 1018   ALT 21 12/03/2013 1018   ALKPHOS 108 12/03/2013 1018   BILITOT 0.6 12/03/2013 1018   GFRNONAA 67 12/03/2013 1018   GFRAA 78 12/03/2013 1018    ASSESSMENT AND PLAN 69 y.o. year old female  has a past medical history of Pneumonia; Arthritis; MS (multiple sclerosis); Anxiety; Depression; Hypertension; Diverticulosis; Asthma; COPD (chronic obstructive pulmonary disease); Chronic pain syndrome; Degenerative arthritis; Memory deficits (12/03/2013); and Abnormality of gait (12/03/2013). here with:  1. Multiple sclerosis 2. Abnormality of gait  Overall the patient has remained stable. She was recently started on Aubagio and is doing well. The patient is having trouble with the cost of this medication. I have consulted with Shanda Bumps our pharmacy technician and she  is contacting the company. The patient did have lab work completed at her primary care office. I will see if this is been faxed to [place]. If it has not I will request this from her primary care provider. The patient has been getting her liver function test monthly with her primary care provider. She was advised that if her symptoms worsen or she develops new symptoms she should let us know. Otherwise she will follow-up in 3-4 months or sooner if needed.   Casey Penny, MSN, NP-C 03/10/2014, 11:00 AM [facility] [address], [address] [place], [place] [address] [phone]  Note: This document was prepared with digital dictation and possible smart phrase technology. Any transcriptional errors that result from this process are unintentional.

## 2014-03-15 ENCOUNTER — Telehealth: Payer: Self-pay | Admitting: Adult Health

## 2014-03-15 ENCOUNTER — Telehealth: Payer: Self-pay | Admitting: *Deleted

## 2014-03-15 NOTE — Telephone Encounter (Signed)
Our office has not received patient's lab work from PCP. I will have my assistance call the patient's PCP and have them fax over the lab work for our review.

## 2014-03-15 NOTE — Telephone Encounter (Signed)
called the pcp again to get the lab results faxed over no answer

## 2014-03-15 NOTE — Telephone Encounter (Signed)
called the patient pcp no answer left a messgae for nurse to call me back for lab results

## 2014-03-18 NOTE — Telephone Encounter (Signed)
Called office back again and spoke with Herbert Seta [phone]. Herbert Seta stated she would fax me her labs soon.

## 2014-03-18 NOTE — Telephone Encounter (Signed)
Called and left message for medical records and I will call them back again Left message x2 .

## 2014-03-18 NOTE — Telephone Encounter (Signed)
Lab's were faxed. Megan in your in box.

## 2014-03-19 NOTE — Telephone Encounter (Signed)
Liver function panel was normal.

## 2014-03-25 ENCOUNTER — Encounter: Payer: Self-pay | Admitting: *Deleted

## 2014-04-14 NOTE — Telephone Encounter (Signed)
This encounter was created in error - please disregard.

## 2014-04-20 ENCOUNTER — Telehealth: Payer: Self-pay | Admitting: Neurology

## 2014-04-20 MED ORDER — TERIFLUNOMIDE 14 MG PO TABS: 14.0000 mg | ORAL_TABLET | Freq: Every day | ORAL | Status: DC

## 2014-04-20 NOTE — Telephone Encounter (Signed)
Patient is calling as she received a letter from her insurance [employer] stating they would no longer pay for Rx Aubagio 14 mg being sent to [facility].  They want her to use [facility] [phone] and fax# [phone] to be covered. Please call patient.  Thanks!

## 2014-04-20 NOTE — Telephone Encounter (Signed)
Rx has now been sent to new Pharmacy, [facility].  I called the patient back to advise.  She is aware.

## 2014-07-27 ENCOUNTER — Ambulatory Visit: Payer: Medicare PPO | Admitting: Adult Health

## 2014-07-29 ENCOUNTER — Encounter: Payer: Self-pay | Admitting: Adult Health

## 2014-07-29 ENCOUNTER — Telehealth: Payer: Self-pay

## 2014-07-29 ENCOUNTER — Ambulatory Visit (INDEPENDENT_AMBULATORY_CARE_PROVIDER_SITE_OTHER): Payer: Medicare PPO | Admitting: Adult Health

## 2014-07-29 ENCOUNTER — Ambulatory Visit: Payer: Medicare PPO | Admitting: Adult Health

## 2014-07-29 VITALS — BP 174/93 | HR 68 | Ht 65.0 in | Wt 133.0 lb

## 2014-07-29 DIAGNOSIS — R269 Unspecified abnormalities of gait and mobility: Secondary | ICD-10-CM | POA: Diagnosis not present

## 2014-07-29 DIAGNOSIS — G35 Multiple sclerosis: Secondary | ICD-10-CM | POA: Diagnosis not present

## 2014-07-29 NOTE — Telephone Encounter (Signed)
Called patient and left her a message stating we need to get a copy of her MRI . I relayed if Where did she have and could she get MRI faxed to Medical Records [phone]. Stanton Kidney will you follow for Megan to review next week.

## 2014-07-29 NOTE — Patient Instructions (Signed)
Continue Aubagio  Have blood work faxed to our office once completed.  If your symptoms worsen or you develop new symptoms please let us know.

## 2014-07-29 NOTE — Progress Notes (Signed)
PATIENT: Casey Young DOB: 09/25/1945  REASON FOR VISIT: follow up- multiple sclerosis, abnormality of gait HISTORY FROM: patient  HISTORY OF PRESENT ILLNESS: Miss Young is a 69 year old female with a history of multiple sclerosis. She returns today for follow-up. The patient continues to take Aubagio and tolerating it well. She reports that she feels that her symptoms have improved since the last visit. She has continued doing physical therapy twice a week as well as water therapy. She also goes to the gym. Uses a Rollator when ambulating. She states that she's had one fall and that was while she was at the beach. She did not suffer any injuries. She denies any changes with her bowels or bladder. Continues to have episodic incontinence at times. Denies any changes with her vision. Denies any new numbness or weakness. The patient does state that her husband of 48 years passed away suddenly 3 weeks ago. She continues to try to manage her grief. Her daughter is with her today at the visit. They are planning a beach trip to [place] in the coming weeks. She denies any new neurological symptoms. Returns today for an evaluation. HISTORY 03/10/14: Casey Young is a 69 year old female with a history multiple sclerosis. She returns today for follow-up. She was given a prescription to start Aubagio after not being on disease modifying mediation for 3 years. She reports that she is doing well with this. Her husband feels that it has improved her strength. Dr. Anne Hahn also ordered an MRI of the Brain and cervical spine the patient has not had these done yet. 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 She uses a walker to ambulate. She came today in a wheelchair just due to the distance. She reports she has had 7-8 falls since the last visit. Fortunately she has not suffered any injuries besides bruising. She is in physical therapy 2 times a week. She does have bladder and bowel incontinence  intermittently. Patient continues to have weakness on the right side. No new weakness or numbness. Patient states that she recently blood work with her PCP and that was suppose to be faxed to our office.   HISTORY 12/03/13 (WILLIS): Casey Young is a 69 year old right-handed white female with a history of multiple sclerosis that was diagnosed initially 36. At that time, she presented with weakness involving the right arm and the right leg associated with numbness and tingling sensations on that side as well. The patient also developed decreased visual acuity involving the right eye. MRI evaluation showed multiple white matter lesions, and lumbar puncture was done to confirm the diagnosis. The patient has been placed on Betaseron, and treated for her multiple sclerosis by Dr. Vergia Alcon. The patient has continued to have her right-sided symptoms, and she has developed some cognitive issues as well and she has a chronic gait disorder. The patient has fallen on several occasions, with a tendency to go backwards. The patient is using a walker, and she has used a walker for least 10 years. She indicates that she still has some exacerbations of her MS, the last occurring one month ago with a sudden change in numbness and gait instability. The patient went off of her Betaseron 3 years ago, she did this on her own, and she does not recall why she stopped the drug. The patient reports troubles controlling the bladder with chronic incontinence. She has not had any physical therapy recently. She comes to this office for an evaluation.  REVIEW OF SYSTEMS: Out of a complete 14 system review of symptoms, the patient complains only of the following symptoms, and all other reviewed systems are negative.  ALLERGIES: Allergies  Allergen Reactions  . Penicillins Rash    HOME MEDICATIONS: Outpatient Prescriptions Prior to Visit  Medication Sig Dispense Refill  . albuterol (PROAIR HFA) 108 (90 BASE) MCG/ACT  inhaler Inhale into the lungs.    . ALPRAZolam (XANAX) 0.5 MG tablet Take 0.5 mg by mouth 3 (three) times daily.    Marland Kitchen amitriptyline (ELAVIL) 50 MG tablet Take 100 mg by mouth at bedtime.    Marland Kitchen amLODipine (NORVASC) 5 MG tablet Take 5 mg by mouth daily.    . cyanocobalamin 500 MCG tablet Take by mouth.    . donepezil (ARICEPT) 5 MG tablet Take 5 mg by mouth at bedtime.    Marland Kitchen FLUoxetine (PROZAC) 40 MG capsule Take 40 mg by mouth daily.    Marland Kitchen gabapentin (NEURONTIN) 300 MG capsule Take 600 mg by mouth 2 (two) times daily.     . methadone (DOLOPHINE) 10 MG tablet Take 10 mg by mouth 2 (two) times daily.    . naproxen (NAPROSYN) 500 MG tablet Take 500 mg by mouth 2 (two) times daily with a meal.    . potassium chloride SA (K-DUR,KLOR-CON) 20 MEQ tablet Take 20 mEq by mouth daily.    . Teriflunomide 14 MG TABS Take 14 mg by mouth daily. (Aubagio) 30 tablet 6   No facility-administered medications prior to visit.    PAST MEDICAL HISTORY: Past Medical History  Diagnosis Date  . Pneumonia     over 5 yrs ago  . Arthritis   . MS (multiple sclerosis)   . Anxiety   . Depression   . Hypertension   . Diverticulosis   . Asthma   . COPD (chronic obstructive pulmonary disease)   . Chronic pain syndrome   . Degenerative arthritis   . Memory deficits 12/03/2013  . Abnormality of gait 12/03/2013    PAST SURGICAL HISTORY: Past Surgical History  Procedure Laterality Date  . Tonsillectomy    . Cholecystectomy    . Appendectomy    . Abdominal hysterectomy    . Back surgery      lumbar   x 3 surgeries  . Shoulder hemi-arthroplasty  05/07/2011    Procedure: SHOULDER HEMI-ARTHROPLASTY;  Surgeon: Raymon Mutton, MD;  Location: [facility] OR;  Service: Orthopedics;  Laterality: Left;  . Tubal ligation Bilateral   . Cataract extraction Bilateral     FAMILY HISTORY: Family History  Problem Relation Age of Onset  . Stroke Mother   . Heart attack Father   . Heart failure Sister   . Diabetes Brother   .  Heart disease Brother     SOCIAL HISTORY: History   Social History  . Marital Status: Married    Spouse Name: N/A  . Number of Children: N/A  . Years of Education: N/A   Occupational History  . Not on file.   Social History Main Topics  . Smoking status: Never Smoker   . Smokeless tobacco: Never Used  . Alcohol Use: No  . Drug Use: No  . Sexual Activity: Not on file   Other Topics Concern  . Not on file   Social History Narrative      PHYSICAL EXAM  Filed Vitals:   07/29/14 1452  BP: 174/93  Pulse: 68  Height: 5\' 5"  (1.651 m)  Weight: 133 lb (60.328 kg)  Body mass index is 22.13 kg/(m^2).  Generalized: Well developed, in no acute distress   Neurological examination  Mentation: Alert oriented to time, place, history taking. Follows all commands speech and language fluent. Tearful when speaking about her husband. Cranial nerve II-XII: Pupils were equal round reactive to light. Extraocular movements were full, visual field were full on confrontational test. Facial sensation and strength were normal. Uvula tongue midline. Head turning and shoulder shrug  were normal and symmetric. Motor: The motor testing reveals 5 over 5 strength of all 4 extremities. Good symmetric motor tone is noted throughout.  Sensory: Sensory testing is intact to soft touch on all 4 extremities. No evidence of extinction is noted.  Coordination: Cerebellar testing reveals good finger-nose-finger and heel-to-shin bilaterally.  Gait and station: Gait is normal. Tandem gait is normal. Romberg is negative. No drift is seen.  Reflexes: Deep tendon reflexes are symmetric and normal bilaterally.   DIAGNOSTIC DATA (LABS, IMAGING, TESTING) - I reviewed patient records, labs, notes, testing and imaging myself where available.  ASSESSMENT AND PLAN 69 y.o. year old female  has a past medical history of Pneumonia; Arthritis; MS (multiple sclerosis); Anxiety; Depression; Hypertension; Diverticulosis;  Asthma; COPD (chronic obstructive pulmonary disease); Chronic pain syndrome; Degenerative arthritis; Memory deficits (12/03/2013); and Abnormality of gait (12/03/2013). here with:  1. Multiple sclerosis 2. Abnormality of gait  Overall the patient has remained stable. She will continue taking Aubagio. I have encouraged patient to continue with physical therapy, water therapy and going to the gym. Patient would like to have the MRI of the brain and spine. We will get this set up for the patient. Patient will have blood work completed at her primary care office this month. She will have this faxed to our office. Patient advised that if her symptoms worsen or she develops new symptom she will let us know. Otherwise she will follow-up in 6 months or sooner if needed.   Butch Penny, MSN, NP-C 07/29/2014, 3:04 PM [facility] [address] [place], [place] [address] [phone]  Note: This document was prepared with digital dictation and possible smart phrase technology. Any transcriptional errors that result from this process are unintentional.

## 2014-07-29 NOTE — Progress Notes (Signed)
I have read the note, and I agree with the clinical assessment and plan.  Casey Young   

## 2014-08-03 NOTE — Telephone Encounter (Signed)
Report on Casey Young desk.

## 2014-08-13 ENCOUNTER — Telehealth: Payer: Self-pay | Admitting: Neurology

## 2014-08-13 NOTE — Telephone Encounter (Signed)
Daughter called stating they are at the beach and patient has had a relapse with MS. It started early Wednesday morning, yesterday a little better and was able to stand with help but today she has slurred speech and very confused. She doesn't have any control over going to the bathroom. She is inquiring if patient should be taken to local hospital (they are at [facility]). They are due to return home tomorrow.  Please call and advise. She can be reached at [phone].

## 2014-08-13 NOTE — Telephone Encounter (Signed)
I called the patient's daughter and advised that she take her mother to the hospital. She was hesitant because there is only an urgent care near to where she is. I advised she at least go see a doctor at the urgent care office to determine if she is able to wait until she comes home to see [place] or if she needs immediate care/infusion. The patient's daughter verbalized understanding and is taking her to the urgent care now.

## 2014-08-13 NOTE — Telephone Encounter (Signed)
Patient's daughter Andrey Campanile) called regarding a return phone call. She doesn't really want to take her mother to hospital out of town. Please call and advise.

## 2014-08-16 ENCOUNTER — Telehealth: Payer: Self-pay | Admitting: Neurology

## 2014-08-16 NOTE — Telephone Encounter (Signed)
The patient was admitted to the hospital with a urinary tract infection, white blood count was 19,000. She is growing out gram-negative rods. She had diffuse weakness of the arms and legs associated with this. The patient is on Aubagio, the liver enzymes were elevated, ALT is greater than 400, AST greater than 200. The medication has been stopped. We may need to go to another medication. The patient will undergo a five-day course of Solu-Medrol, then go on a prednisone Dosepak, 10 mg 12 day pack. She will go into rehabilitation following the consultation. We will need to see her back here in 2 weeks.

## 2014-08-16 NOTE — Telephone Encounter (Signed)
I called the patient and left a voicemail asking her to call me back.  

## 2014-08-16 NOTE — Telephone Encounter (Signed)
Please call Dr Derrell Lolling Cell# [phone] or [phone] and have Operator page her

## 2014-08-17 NOTE — Telephone Encounter (Signed)
I called the patient and left a voicemail asking her to call me back to schedule an appointment.

## 2014-08-19 NOTE — Telephone Encounter (Signed)
Appointment made 8/10.

## 2014-08-25 ENCOUNTER — Telehealth: Payer: Self-pay | Admitting: Neurology

## 2014-08-25 ENCOUNTER — Ambulatory Visit: Payer: Medicare PPO | Admitting: Neurology

## 2014-08-25 NOTE — Telephone Encounter (Signed)
This patient canceled appointment several hours prior to the appointment time.

## 2014-09-03 ENCOUNTER — Encounter: Payer: Self-pay | Admitting: Neurology

## 2014-09-07 ENCOUNTER — Telehealth: Payer: Self-pay | Admitting: Neurology

## 2014-09-07 NOTE — Telephone Encounter (Signed)
Tim, son in law called and would like to be notified about pts appts. She is in nursing facility and will not be able to confirm appt.  Please call [phone].

## 2014-09-08 NOTE — Telephone Encounter (Signed)
The patient's son in law is not listed on her DPR. I called to explain this but was unable to reach Tim. This needs to be updated at the next office visit (9/8).

## 2014-09-09 ENCOUNTER — Ambulatory Visit: Payer: Medicare PPO | Admitting: Neurology

## 2014-09-23 ENCOUNTER — Encounter: Payer: Self-pay | Admitting: Neurology

## 2014-09-23 ENCOUNTER — Ambulatory Visit: Payer: Medicare PPO | Admitting: Neurology

## 2014-09-23 ENCOUNTER — Ambulatory Visit (INDEPENDENT_AMBULATORY_CARE_PROVIDER_SITE_OTHER): Payer: Medicare PPO | Admitting: Neurology

## 2014-09-23 VITALS — BP 159/87 | HR 78 | Ht 60.0 in

## 2014-09-23 DIAGNOSIS — G35 Multiple sclerosis: Secondary | ICD-10-CM | POA: Diagnosis not present

## 2014-09-23 DIAGNOSIS — R413 Other amnesia: Secondary | ICD-10-CM | POA: Diagnosis not present

## 2014-09-23 DIAGNOSIS — R269 Unspecified abnormalities of gait and mobility: Secondary | ICD-10-CM

## 2014-09-23 DIAGNOSIS — Z5181 Encounter for therapeutic drug level monitoring: Secondary | ICD-10-CM

## 2014-09-23 NOTE — Progress Notes (Addendum)
Reason for visit: Multiple sclerosis  Casey Young is an 69 y.o. female  History of present illness:  Casey Young is a 69 year old right-handed white female with a history of multiple sclerosis. The patient had been on Betaseron for a number of years, she was switched to Aubagio, and seemed to do fairly well with this. The patient indicates that she lost her husband in June 2016. She was taking a vacation at [place], [place] around 08/16/2014. The patient suddenly became weak, she was taken to the hospital, she was found to have urinary sepsis. The patient required hospitalization, and then was transferred to a rehabilitation facility. The patient is still in the rehabilitation facility, and she is to return home tomorrow. She is slowly regaining her ability to walk, she has not yet returned to baseline. She was treated with a steroid taper. She is off the taper at this point. She was taken off of Aubagio as the liver enzymes significantly elevated. The patient has some residual numbness of the right hand. She denies any new vision changes, weakness, or bladder or bowel control problems. She will be moving in with her daughter and son-in-law. The patient comes back to this office for an evaluation. Recent MRI evaluations of the brain and cervical spine were done. MRI of the brain shows 3 areas of increased diffusion signal in the brain posteriorly, without enhancement. Deep white matter lesions are also seen. MRI of the cervical spine shows no abnormalities within the cervical cord.  Past Medical History  Diagnosis Date  . Pneumonia     over 5 yrs ago  . Arthritis   . MS (multiple sclerosis)   . Anxiety   . Depression   . Hypertension   . Diverticulosis   . Asthma   . COPD (chronic obstructive pulmonary disease)   . Chronic pain syndrome   . Degenerative arthritis   . Memory deficits 12/03/2013  . Abnormality of gait 12/03/2013  . UTI (urinary tract infection)     Past  Surgical History  Procedure Laterality Date  . Tonsillectomy    . Cholecystectomy    . Appendectomy    . Abdominal hysterectomy    . Back surgery      lumbar   x 3 surgeries  . Shoulder hemi-arthroplasty  05/07/2011    Procedure: SHOULDER HEMI-ARTHROPLASTY;  Surgeon: Raymon Mutton, MD;  Location: [facility] OR;  Service: Orthopedics;  Laterality: Left;  . Tubal ligation Bilateral   . Cataract extraction Bilateral     Family History  Problem Relation Age of Onset  . Stroke Mother   . Heart attack Father   . Heart failure Sister   . Diabetes Brother   . Heart disease Brother     Social history:  reports that she has never smoked. She has never used smokeless tobacco. She reports that she does not drink alcohol or use illicit drugs.    Allergies  Allergen Reactions  . Penicillins Rash    Medications:  Prior to Admission medications   Medication Sig Start Date End Date Taking? Authorizing Provider  albuterol (PROAIR HFA) 108 (90 BASE) MCG/ACT inhaler Inhale into the lungs.   Yes Historical Provider, MD  ALPRAZolam Prudy Feeler) 0.5 MG tablet Take 0.25 mg by mouth 3 (three) times daily.    Yes Historical Provider, MD  amitriptyline (ELAVIL) 50 MG tablet Take 100 mg by mouth at bedtime.   Yes Historical Provider, MD  amLODipine (NORVASC) 5 MG tablet Take 5  mg by mouth daily.   Yes Historical Provider, MD  bisacodyl (DULCOLAX) 5 MG EC tablet Take 10 mg by mouth daily as needed for moderate constipation.   Yes Historical Provider, MD  celecoxib (CELEBREX) 200 MG capsule Take 200 mg by mouth daily.   Yes Historical Provider, MD  cyanocobalamin 500 MCG tablet Take by mouth.   Yes Historical Provider, MD  docusate sodium (COLACE) 100 MG capsule Take 100 mg by mouth 2 (two) times daily.   Yes Historical Provider, MD  donepezil (ARICEPT) 5 MG tablet Take 5 mg by mouth at bedtime.   Yes Historical Provider, MD  ergotamine-caffeine (CAFERGOT) 1-100 MG per tablet Take 1 tablet by mouth daily. Two  tablets at onset of attack; then 1 tablet every 30 minutes as needed; maximum: 6 tablets per attack; do not exceed 10 tablets/week   Yes Historical Provider, MD  FLUoxetine (PROZAC) 40 MG capsule Take 40 mg by mouth daily.   Yes Historical Provider, MD  gabapentin (NEURONTIN) 300 MG capsule Take 600 mg by mouth 2 (two) times daily.    Yes Historical Provider, MD  lisinopril (PRINIVIL,ZESTRIL) 20 MG tablet Take 20 mg by mouth daily.   Yes Historical Provider, MD  methadone (DOLOPHINE) 10 MG tablet Take 10 mg by mouth 2 (two) times daily.   Yes Historical Provider, MD  Teriflunomide 14 MG TABS Take 14 mg by mouth daily. Ashok Cordia) Patient not taking: Reported on 09/23/2014 04/20/14   Casey Spaniel, MD    ROS:  Out of a complete 14 system review of symptoms, the patient complains only of the following symptoms, and all other reviewed systems are negative.  Frequency of urination Joint pain, back pain Pressure sores, heels Memory loss, dizziness, numbness, weakness Depression  Blood pressure 159/87, pulse 78, height 5' (1.524 m).  Physical Exam  General: The patient is alert and cooperative at the time of the examination.  Skin: No significant peripheral edema is noted.   Neurologic Exam  Mental status: The patient is alert and oriented x 3 at the time of the examination. The patient has apparent normal recent and remote memory, with an apparently normal attention span and concentration ability.   Cranial nerves: Facial symmetry is present. Speech is normal, no aphasia or dysarthria is noted. Extraocular movements are full. Visual fields are full. Pupils are equal, round, and reactive to light. Discs are flat bilaterally.  Motor: The patient has good strength in all 4 extremities.  Sensory examination: Soft touch sensation is decreased on the right face, arm, and leg.  Coordination: The patient has good finger-nose-finger bilaterally, the patient has some difficulty performing  heel-to-shin bilaterally..  Gait and station: The patient requires some assistance with standing. Once up, the patient has a wide-based gait, tendency to lean backwards. Tandem gait was not attempted.  Reflexes: Deep tendon reflexes are symmetric.   Assessment/Plan:  1. Multiple sclerosis  2. Gait disturbance  The patient has recently had a significant illness with urinary sepsis. The patient has had a protracted recovery time, she still is not at her usual baseline. This likely represents a pseudo-exacerbation of the multiple sclerosis. She will require ongoing physical and occupational therapy at home once she is discharged from the rehabilitation facility. We will consider a switch to Tecfidera at this time. Blood work will be done today. She will follow-up in 3-4 months.  Marlan Palau MD 09/23/2014 6:31 PM  [facility] [address] [place], [place] [address]  Phone  [phone] Fax [phone]

## 2014-09-23 NOTE — Patient Instructions (Addendum)
    We will check blood work today, and try to switch to Tecfidera for the MS.   Multiple Sclerosis Multiple sclerosis (MS) is a disease of the central nervous system. It leads to the loss of the insulating covering of the nerves (myelin sheath) of your brain. When this happens, brain signals do not get sent properly or may not get sent at all. The age of onset of MS varies.  CAUSES The cause of MS is unknown. However, it is more common in the [place] than in the [place]. RISK FACTORS There is a higher number of women with MS than men. MS is not an illness that is passed down to you from your family members (inherited). However, your risk of MS is higher if you have a relative with MS. SIGNS AND SYMPTOMS  The symptoms of MS occur in episodes or attacks. These attacks may last weeks to months. There may be long periods of almost no symptoms between attacks. The symptoms of MS vary. This is because of the many different ways it affects the central nervous system. The main symptoms of MS include:  Vision problems and eye pain.  Numbness.  Weakness.  Inability to move your arms, hands, feet, or legs (paralysis).  Balance problems.  Tremors. DIAGNOSIS  Your health care provider can diagnose MS with the help of imaging exams and lab tests. These may include specialized X-ray exams and spinal fluid tests. The best imaging exam to confirm a diagnosis of MS is an MRI. TREATMENT  There is no known cure for MS, but there are medicines that can decrease the number and frequency of attacks. Steroids are often used for short-term relief. Physical and occupational therapy may also help. There are also many new alternative or complementary treatments available to help control the symptoms of MS. Ask your health care provider if any of these other options are right for you. HOME CARE INSTRUCTIONS   Take medicines as directed by your health care provider.  Exercise as  directed by your health care provider. SEEK MEDICAL CARE IF: You begin to feel depressed. SEEK IMMEDIATE MEDICAL CARE IF:  You develop paralysis.  You have problems with bladder, bowel, or sexual function.  You develop mental changes, such as forgetfulness or mood swings.  You have a period of uncontrolled movements (seizure). Document Released: 12/30/1999 Document Revised: 01/06/2013 Document Reviewed: 09/08/2012 [facility] Patient Information 2015 [place], [place]. This information is not intended to replace advice given to you by your health care provider. Make sure you discuss any questions you have with your health care provider.

## 2014-09-24 LAB — CBC WITH DIFFERENTIAL/PLATELET
BASOS ABS: 0.1 10*3/uL (ref 0.0–0.2)
Basos: 2 %
EOS (ABSOLUTE): 0.4 10*3/uL (ref 0.0–0.4)
Eos: 6 %
Hematocrit: 39.8 % (ref 34.0–46.6)
Hemoglobin: 13 g/dL (ref 11.1–15.9)
IMMATURE GRANULOCYTES: 0 %
Immature Grans (Abs): 0 10*3/uL (ref 0.0–0.1)
LYMPHS ABS: 1.7 10*3/uL (ref 0.7–3.1)
Lymphs: 26 %
MCH: 27.4 pg (ref 26.6–33.0)
MCHC: 32.7 g/dL (ref 31.5–35.7)
MCV: 84 fL (ref 79–97)
Monocytes Absolute: 0.7 10*3/uL (ref 0.1–0.9)
Monocytes: 11 %
NEUTROS PCT: 55 %
Neutrophils Absolute: 3.5 10*3/uL (ref 1.4–7.0)
PLATELETS: 330 10*3/uL (ref 150–379)
RBC: 4.74 x10E6/uL (ref 3.77–5.28)
RDW: 14.5 % (ref 12.3–15.4)
WBC: 6.4 10*3/uL (ref 3.4–10.8)

## 2014-09-24 LAB — COMPREHENSIVE METABOLIC PANEL
ALK PHOS: 123 IU/L — AB (ref 39–117)
ALT: 14 IU/L (ref 0–32)
AST: 18 IU/L (ref 0–40)
Albumin/Globulin Ratio: 1.6 (ref 1.1–2.5)
Albumin: 4 g/dL (ref 3.5–4.8)
BILIRUBIN TOTAL: 0.4 mg/dL (ref 0.0–1.2)
BUN/Creatinine Ratio: 12 (ref 11–26)
BUN: 11 mg/dL (ref 8–27)
CHLORIDE: 98 mmol/L (ref 97–108)
CO2: 26 mmol/L (ref 18–29)
Calcium: 9.4 mg/dL (ref 8.7–10.3)
Creatinine, Ser: 0.92 mg/dL (ref 0.57–1.00)
GFR calc Af Amer: 73 mL/min/{1.73_m2} (ref >59)
GFR calc non Af Amer: 63 mL/min/{1.73_m2} (ref >59)
GLOBULIN, TOTAL: 2.5 g/dL (ref 1.5–4.5)
Glucose: 115 mg/dL — ABNORMAL HIGH (ref 65–99)
Potassium: 3.9 mmol/L (ref 3.5–5.2)
SODIUM: 143 mmol/L (ref 134–144)
Total Protein: 6.5 g/dL (ref 6.0–8.5)

## 2014-09-27 ENCOUNTER — Telehealth: Payer: Self-pay

## 2014-09-27 NOTE — Telephone Encounter (Signed)
I called the patient and relayed results. 

## 2014-09-27 NOTE — Telephone Encounter (Signed)
-----   Message from York Spaniel, MD sent at 09/24/2014  7:39 AM EDT ----- Blood work is unremarkable with exception of a minimal elevation of alkaline phosphatase, rest of liver panel is now normal. CBC is normal. Okay to start Tecfidera when available. Please call the patient. ----- Message -----    From: [facility] Lab Results In Interface    Sent: 09/24/2014   5:41 AM      To: York Spaniel, MD

## 2014-10-04 ENCOUNTER — Telehealth: Payer: Self-pay | Admitting: Neurology

## 2014-10-04 ENCOUNTER — Telehealth: Payer: Self-pay

## 2014-10-04 NOTE — Telephone Encounter (Signed)
[employer] has approved the request for coverage on Tecfidera effective until 10/03/2016 or until the policy changes or is terminated Ref# [id number]

## 2014-10-04 NOTE — Telephone Encounter (Signed)
Lexie with [employer] called stating patients signature on wrong line. She decided to call patient to fill out HIPPA form with signature in correct place since Dr Anne Hahn is not in office today.

## 2014-10-19 ENCOUNTER — Telehealth: Payer: Self-pay | Admitting: Neurology

## 2014-10-19 NOTE — Telephone Encounter (Signed)
Casey Young [facility] [place], [place] called stating pt had taken 1st dose of Tefidera  today at 9 and when she got there at 11 she had fine rash upper body, face,chest arms, trunk, legs and upper back. She advised her not to take 2nd dose today and advised her not to take benadryl until talking with our office. Barbara's number is [phone]. Patient can be reached [phone].

## 2014-10-19 NOTE — Telephone Encounter (Signed)
I called the patient and spoke to [facility], her physical therapist. She stated the patient's home health nurse, Britta Mccreedy, noticed a rash this morning around 11 am, but Toni Amend does not see a rash now. Per Dr. Terrace Arabia, patient should take a baby aspirin (81 mg) today and hold off on Tecfidera for the rest of the day. She should take another baby aspirin in the am and then take Tecfidera, if she tolerates it well, then she can take the evening dose tomorrow as well. I asked that she call us back and let us know how she is doing with it.

## 2014-10-21 NOTE — Telephone Encounter (Signed)
I called the patient. The patient indicated that at the time that the room health nurse noticed the rash, she was having a hot flash, flushing from the Tecfidera. This likely does not represent a true rash, she is to continue the medication, but she is to be vigilant for a true rash that may develop.

## 2014-10-21 NOTE — Telephone Encounter (Signed)
I called the patient to check on her. She states that she is doing fine and she never noticed the rash her home health nurse noticed. The home health PT did not see it either. I advised that I would check with Dr. Anne Hahn to see if she could stop taking the baby aspirin.

## 2014-11-01 ENCOUNTER — Telehealth: Payer: Self-pay | Admitting: Neurology

## 2014-11-01 NOTE — Telephone Encounter (Signed)
Patient is calling and states that her insurance company [employer] will no longer cover Tecfidera and suggested she use Copaxone.  Please call.

## 2014-11-01 NOTE — Telephone Encounter (Signed)
It appears we have already gotten a prior auth override approved for Tecfidera (Please see note from 09/19).  I called back and spoke with patient.  She is aware, and will call us back if anything further is needed.

## 2014-12-28 ENCOUNTER — Telehealth: Payer: Self-pay | Admitting: Neurology

## 2014-12-28 NOTE — Telephone Encounter (Signed)
Pt called and says that her insurance will no longer cover Dimethyl Fumarate (TECFIDERA) 240 MG CPDR starting Jan. 2017. She wants to know if there is something else she can take or what her other options are. Please call and advise [phone]

## 2014-12-29 NOTE — Telephone Encounter (Signed)
I apologize for the delay, however, I have been out of the office.  I called the patient back. Says she has spoken with [employer], and they have approved her for the free drug program.  They are shipping medication the end of this week.  She will call us back if anything changes.

## 2015-01-19 ENCOUNTER — Telehealth: Payer: Self-pay | Admitting: Neurology

## 2015-01-19 NOTE — Telephone Encounter (Signed)
Blood work has been done through the primary care physician on 12/24/2014, the patient is on Tecfidera. White blood count was 10.1, hemoglobin 13.8, hematocrit of 40.9, platelets of 273. Absolute lymphocyte count of 0.8 BUN is 11, creatinine 0.67, sodium 144, potassium 4.3 liver profile was unremarkable.

## 2015-02-01 ENCOUNTER — Ambulatory Visit: Payer: Medicare PPO | Admitting: Adult Health

## 2015-02-01 ENCOUNTER — Ambulatory Visit (INDEPENDENT_AMBULATORY_CARE_PROVIDER_SITE_OTHER): Payer: Medicare PPO | Admitting: Neurology

## 2015-02-01 ENCOUNTER — Encounter: Payer: Self-pay | Admitting: Neurology

## 2015-02-01 VITALS — BP 150/80 | HR 80 | Ht 60.0 in | Wt 134.0 lb

## 2015-02-01 DIAGNOSIS — G35D Multiple sclerosis, unspecified: Secondary | ICD-10-CM

## 2015-02-01 DIAGNOSIS — N319 Neuromuscular dysfunction of bladder, unspecified: Secondary | ICD-10-CM

## 2015-02-01 DIAGNOSIS — G35 Multiple sclerosis: Secondary | ICD-10-CM

## 2015-02-01 DIAGNOSIS — G5620 Lesion of ulnar nerve, unspecified upper limb: Secondary | ICD-10-CM

## 2015-02-01 DIAGNOSIS — R269 Unspecified abnormalities of gait and mobility: Secondary | ICD-10-CM

## 2015-02-01 DIAGNOSIS — R413 Other amnesia: Secondary | ICD-10-CM

## 2015-02-01 DIAGNOSIS — G5621 Lesion of ulnar nerve, right upper limb: Secondary | ICD-10-CM | POA: Diagnosis not present

## 2015-02-01 HISTORY — DX: Neuromuscular dysfunction of bladder, unspecified: N31.9

## 2015-02-01 HISTORY — DX: Lesion of ulnar nerve, unspecified upper limb: G56.20

## 2015-02-01 NOTE — Progress Notes (Signed)
Reason for visit: Multiple sclerosis  Casey Young is an 70 y.o. female  History of present illness:  Ms. Casey Young is a 70 year old right-handed white female with a history of multiple sclerosis associated with a gait disorder. The patient has had worsening of her physical condition following a severe bladder infection associated with sepsis in the summer of 2016. The patient has not fully recovered from this. She is walking short distances with a walker, she has a tendency to lean backwards, she will fall on occasion. She has had significant issues with ongoing urinary frequency, she indicates that she gets up about 15 times at nighttime to urinate, still has urinary incontinence, and she will urinate every 2 hours during the day. The patient tries to limit her fluid intake in the evening hours. The patient reports some problems with right hand weakness that developed during her severe illness. The patient has decreased grip with the right hand and numbness of the fourth and fifth fingers. 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 The patient has not seen a urology physician for her bladder yet. Her primary care physician is getting this set up. The patient returns for an evaluation. She is on Tecfidera, she is tolerating medication well at this time. She has had recent blood work done in December 2016, this is unremarkable. The patient does have some memory issues at baseline, this has not significantly worsened over time.  Past Medical History  Diagnosis Date  . Pneumonia     over 5 yrs ago  . Arthritis   . MS (multiple sclerosis) (HCC)   . Anxiety   . Depression   . Hypertension   . Diverticulosis   . Asthma   . COPD (chronic obstructive pulmonary disease) (HCC)   . Chronic pain syndrome   . Degenerative arthritis   . Memory deficits 12/03/2013  . Abnormality of gait 12/03/2013  . UTI  (urinary tract infection)   . Neurogenic bladder 02/01/2015    Past Surgical History  Procedure Laterality Date  . Tonsillectomy    . Cholecystectomy    . Appendectomy    . Abdominal hysterectomy    . Back surgery      lumbar   x 3 surgeries  . Shoulder hemi-arthroplasty  05/07/2011    Procedure: SHOULDER HEMI-ARTHROPLASTY;  Surgeon: Raymon Mutton, MD;  Location: [facility] OR;  Service: Orthopedics;  Laterality: Left;  . Tubal ligation Bilateral   . Cataract extraction Bilateral     Family History  Problem Relation Age of Onset  . Stroke Mother   . Dementia Mother   . Heart attack Father   . Heart failure Sister   . Diabetes Brother   . Heart disease Brother     Social history:  reports that she has never smoked. She has never used smokeless tobacco. She reports that she does not drink alcohol or use illicit drugs.    Allergies  Allergen Reactions  . Penicillins Rash    Medications:  Prior to Admission medications   Medication Sig Start Date End Date Taking? Authorizing Provider  albuterol (PROAIR HFA) 108 (90 BASE) MCG/ACT inhaler Inhale into the lungs.   Yes Historical Provider, MD  ALPRAZolam Prudy Feeler) 0.5 MG tablet Take 0.25 mg by mouth 3 (three) times daily.    Yes Historical Provider, MD  amitriptyline (ELAVIL) 50 MG tablet Take 100 mg by  mouth at bedtime.   Yes Historical Provider, MD  amLODipine (NORVASC) 5 MG tablet Take 5 mg by mouth daily.   Yes Historical Provider, MD  bisacodyl (DULCOLAX) 5 MG EC tablet Take 10 mg by mouth daily as needed for moderate constipation.   Yes Historical Provider, MD  celecoxib (CELEBREX) 200 MG capsule Take 200 mg by mouth daily.   Yes Historical Provider, MD  cyanocobalamin 500 MCG tablet Take by mouth.   Yes Historical Provider, MD  Dimethyl Fumarate (TECFIDERA) 240 MG CPDR Take 1 capsule by mouth 2 (two) times daily.    Yes Historical Provider, MD  docusate sodium (COLACE) 100 MG capsule Take 100 mg by mouth 2 (two) times daily.   Yes  Historical Provider, MD  donepezil (ARICEPT) 5 MG tablet Take 5 mg by mouth at bedtime.   Yes Historical Provider, MD  ergotamine-caffeine (CAFERGOT) 1-100 MG per tablet Take 1 tablet by mouth daily. Two tablets at onset of attack; then 1 tablet every 30 minutes as needed; maximum: 6 tablets per attack; do not exceed 10 tablets/week   Yes Historical Provider, MD  FLUoxetine (PROZAC) 40 MG capsule Take 40 mg by mouth daily. Reported on 02/01/2015   Yes Historical Provider, MD  gabapentin (NEURONTIN) 300 MG capsule Take 600 mg by mouth 2 (two) times daily.    Yes Historical Provider, MD  lisinopril (PRINIVIL,ZESTRIL) 20 MG tablet Take 20 mg by mouth daily.   Yes Historical Provider, MD  methadone (DOLOPHINE) 5 MG tablet TK 1 T PO QID 01/10/15  Yes Historical Provider, MD  mupirocin ointment (BACTROBAN) 2 % APLY TO ULCERATION BID 01/19/15  Yes Historical Provider, MD  Teriflunomide 14 MG TABS Take 14 mg by mouth daily. Ashok Cordia) 04/20/14  Yes York Spaniel, MD  tolterodine (DETROL LA) 2 MG 24 hr capsule Take 2 mg by mouth daily.   Yes Historical Provider, MD    ROS:  Out of a complete 14 system review of symptoms, the patient complains only of the following symptoms, and all other reviewed systems are negative.  Gait disorder Numbness, weakness of right hand Urinary frequency  Blood pressure 150/80, pulse 80, height 5' (1.524 m), weight 134 lb (60.782 kg).  Physical Exam  General: The patient is alert and cooperative at the time of the examination.  Skin: No significant peripheral edema is noted.   Neurologic Exam  Mental status: The patient is alert and oriented x 3 at the time of the examination. The Mini-Mental Status Examination done today shows a total score 26/30. The patient is able to name 4 four legged animals in 30 seconds.   Cranial nerves: Facial symmetry is present. Speech is normal, no aphasia or dysarthria is noted. Extraocular movements are full. Visual fields are  full.  Motor: The patient has good strength in all 4 extremities, with exception of some weakness with intrinsic muscles of the right hand, weakness with the distal flexors of the fourth and fifth fingers, patient has a "claw hand". Mild atrophy of the right first dorsal interosseous muscle is noted.  Sensory examination: Soft touch sensation is decreased on the right face, arm, and leg.  Coordination: The patient has good finger-nose-finger and heel-to-shin bilaterally.  Gait and station: The patient requires assistance with standing. Once up, she has a tendency to lean backwards. She is able to walk a short distance with assistance, the gait is wide-based. Tandem gait was not attempted. Romberg is positive, the patient goes backwards.  Reflexes: Deep tendon reflexes  are symmetric.   Assessment/Plan:  1. Multiple sclerosis  2. Gait disorder  3. Neurogenic bladder, urinary incontinence and frequency  4. Right ulnar neuropathy  5. Memory disturbance  The patient continues to have significant issues with her balance with walking. This may have worsened some following the urinary tract infection, but the patient's ability to ambulate has been significantly impaired well prior to the bladder infection. The patient is off of Aubagio secondary to elevation in liver enzymes, she has done well with Tecfidera. We will continue this medication. I have indicated that she is to get an athletic elbow pad to put on her right elbow during the daytime to help protect it and to allow for healing. She will follow-up in 4 months. The patient should be seeing a urologist in the near future regarding the bladder dysfunction.  Marlan Palau MD 02/01/2015 7:33 PM  [facility] [address] [place], [place] [address]  Phone [phone] Fax [phone]

## 2015-05-09 ENCOUNTER — Telehealth: Payer: Self-pay | Admitting: Neurology

## 2015-05-09 NOTE — Telephone Encounter (Signed)
Tresa Endo with [employer] called to inquire about PA on Dimethyl Fumarate (TECFIDERA) 240 MG CPDR. We do not have a refill request on file and Tresa Endo said she would fax on to the office. [phone]

## 2015-05-11 ENCOUNTER — Other Ambulatory Visit: Payer: Self-pay

## 2015-05-11 MED ORDER — DIMETHYL FUMARATE 240 MG PO CPDR: 1.0000 | DELAYED_RELEASE_CAPSULE | Freq: Two times a day (BID) | ORAL | Status: DC

## 2015-05-11 NOTE — Telephone Encounter (Signed)
PA in progress. 

## 2015-05-11 NOTE — Telephone Encounter (Signed)
PA Case: [id number], Status: Approved, Coverage Starts on: 05/11/2015 12:00:00 AM, Coverage Ends on: 01/15/2016 12:00:00 AM. Questions? Contact [phone].

## 2015-05-11 NOTE — Addendum Note (Signed)
Addended by: Donnelly Angelica on: 05/11/2015 04:47 PM   Modules accepted: Orders

## 2015-06-02 ENCOUNTER — Ambulatory Visit: Payer: Medicare PPO | Admitting: Adult Health

## 2015-06-06 ENCOUNTER — Telehealth: Payer: Self-pay | Admitting: *Deleted

## 2015-06-06 NOTE — Telephone Encounter (Signed)
Message For: [facility]                  Taken 22-MAY-17 at  1:48PM by LSJ ------------------------------------------------------------ Casey Young             CID [id number]  Patient SAME                 Pt's [facility]     Area Code 276 Phone# [phone] * DOB [phone]     RE CANCEL 5/23 1:15PM APPT/PLS CALL TO RESCHEDULE                                                         Disp:Y/N N If Y = C/B If No Response In ============================================================ appt r/s. ds

## 2015-06-07 ENCOUNTER — Ambulatory Visit: Payer: Medicare PPO | Admitting: Adult Health

## 2015-06-28 ENCOUNTER — Encounter: Payer: Self-pay | Admitting: Adult Health

## 2015-06-28 ENCOUNTER — Ambulatory Visit (INDEPENDENT_AMBULATORY_CARE_PROVIDER_SITE_OTHER): Payer: Medicare PPO | Admitting: Adult Health

## 2015-06-28 VITALS — BP 146/76 | HR 72 | Resp 16 | Ht 60.0 in | Wt 125.0 lb

## 2015-06-28 DIAGNOSIS — Z5181 Encounter for therapeutic drug level monitoring: Secondary | ICD-10-CM

## 2015-06-28 DIAGNOSIS — G35 Multiple sclerosis: Secondary | ICD-10-CM | POA: Diagnosis not present

## 2015-06-28 DIAGNOSIS — R269 Unspecified abnormalities of gait and mobility: Secondary | ICD-10-CM

## 2015-06-28 NOTE — Patient Instructions (Signed)
Will consider ampyra Will wait for bloodwork If your symptoms worsen or you develop new symptoms please let us know.

## 2015-06-28 NOTE — Progress Notes (Signed)
PATIENT: Casey Young DOB: April 18, 1945  REASON FOR VISIT: follow up- multiple sclerosis, gait disorder HISTORY FROM: patient  HISTORY OF PRESENT ILLNESS: Casey Young is a 70 year old female with a history of multiple sclerosis associated with a gait disorder. She returns today for follow-up. She reports overall she is doing well. She denies any changes with the bowels or bladder. She does state that she followed up with a urologist who put her on oxybutynin. Denies any changes with her vision. Denies any new numbness or weakness. She states most of her symptoms are on the right side. She does have a hard time ambulating. States her gait is very slow occasionally will drag the right foot. She uses a walker for short distances but will use a wheelchair for longer distances. Her daughter thinks that her walking is also affected by the fact that she gets nervous in crowds of people and her fear of falling. She returns today for an evaluation.  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 The patient has had worsening of her physical condition following a severe bladder infection associated with sepsis in the summer of 2016. The patient has not fully recovered from this. She is walking short distances with a walker, she has a tendency to lean backwards, she will fall on occasion. She has had significant issues with ongoing urinary frequency, she indicates that she gets up about 15 times at nighttime to urinate, still has urinary incontinence, and she will urinate every 2 hours during the day. The patient tries to limit her fluid intake in the evening hours. The patient reports some problems with right hand weakness that developed during her severe illness. The patient has decreased grip with the right hand and numbness of the fourth and fifth fingers. The patient has some numbness of the feet as  well, she still has a MRSA infection of the left foot, she is being followed by podiatry. The patient has not seen a urology physician for her bladder yet. Her primary care physician is getting this set up. The patient returns for an evaluation. She is on Tecfidera, she is tolerating medication well at this time. She has had recent blood work done in December 2016, this is unremarkable. The patient does have some memory issues at baseline, this has not significantly worsened over time.  REVIEW OF SYSTEMS: Out of a complete 14 system review of symptoms, the patient complains only of the following symptoms, and all other reviewed systems are negative.  Joint pain, joints 1, back pain, aching muscles, muscle cramps, walking difficulty, rash, depression, nervous/anxious, tremors  ALLERGIES: Allergies  Allergen Reactions  . Aubagio [Teriflunomide]     Increased LFT's  . Penicillins Rash    HOME MEDICATIONS: Outpatient Prescriptions Prior to Visit  Medication Sig Dispense Refill  . ALPRAZolam (XANAX) 0.5 MG tablet Take 0.25 mg by mouth 3 (three) times daily.     Marland Kitchen amitriptyline (ELAVIL) 50 MG tablet Take 100 mg by mouth at bedtime.    Marland Kitchen amLODipine (NORVASC) 5 MG tablet Take 5 mg by mouth daily.    . bisacodyl (DULCOLAX) 5 MG EC tablet Take 10 mg by mouth daily as needed for moderate constipation.    . celecoxib (CELEBREX) 200 MG capsule Take 200 mg by mouth daily.    . cyanocobalamin 500 MCG tablet Take by mouth.    . Dimethyl Fumarate (TECFIDERA) 240 MG CPDR  Take 1 capsule (240 mg total) by mouth 2 (two) times daily. 60 capsule 11  . donepezil (ARICEPT) 5 MG tablet Take 5 mg by mouth at bedtime.    Marland Kitchen FLUoxetine (PROZAC) 40 MG capsule Take 40 mg by mouth daily. Reported on 02/01/2015    . gabapentin (NEURONTIN) 300 MG capsule Take 600 mg by mouth 2 (two) times daily.     . methadone (DOLOPHINE) 5 MG tablet TK 1 T PO QID  0  . albuterol (PROAIR HFA) 108 (90 BASE) MCG/ACT inhaler Inhale into the  lungs. Reported on 06/28/2015    . docusate sodium (COLACE) 100 MG capsule Take 100 mg by mouth 2 (two) times daily. Reported on 06/28/2015    . ergotamine-caffeine (CAFERGOT) 1-100 MG per tablet Take 1 tablet by mouth daily. Reported on 06/28/2015    . lisinopril (PRINIVIL,ZESTRIL) 20 MG tablet Take 20 mg by mouth daily. Reported on 06/28/2015    . mupirocin ointment (BACTROBAN) 2 % Reported on 06/28/2015  2  . Teriflunomide 14 MG TABS Take 14 mg by mouth daily. (Aubagio) (Patient not taking: Reported on 06/28/2015) 30 tablet 6  . tolterodine (DETROL LA) 2 MG 24 hr capsule Take 2 mg by mouth daily. Reported on 06/28/2015     No facility-administered medications prior to visit.    PAST MEDICAL HISTORY: Past Medical History  Diagnosis Date  . Pneumonia     over 5 yrs ago  . Arthritis   . MS (multiple sclerosis) (HCC)   . Anxiety   . Depression   . Hypertension   . Diverticulosis   . Asthma   . COPD (chronic obstructive pulmonary disease) (HCC)   . Chronic pain syndrome   . Degenerative arthritis   . Memory deficits 12/03/2013  . Abnormality of gait 12/03/2013  . UTI (urinary tract infection)   . Neurogenic bladder 02/01/2015  . 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 Tonsillectomy    . Cholecystectomy    . Appendectomy    . Abdominal hysterectomy    . Back surgery      lumbar   x 3 surgeries  . Shoulder hemi-arthroplasty  05/07/2011    Procedure: SHOULDER HEMI-ARTHROPLASTY;  Surgeon: Raymon Mutton, MD;  Location: [facility];  Service: Orthopedics;  Laterality: Left;  . Tubal ligation Bilateral   . Cataract extraction Bilateral     FAMILY HISTORY: Family History  Problem Relation Age of Onset  . Stroke Mother   . Dementia Mother   . Heart attack Father   . Heart failure Sister   . Diabetes Brother   . Heart disease Brother     SOCIAL HISTORY: Social History   Social History  . Marital  Status: Married    Spouse Name: N/A  . Number of Children: 1  . Years of Education: 12   Occupational History  . retired    Social History Main Topics  . Smoking status: Never Smoker   . Smokeless tobacco: Never Used  . Alcohol Use: No  . Drug Use: No  . Sexual Activity: Not on file   Other Topics Concern  . Not on file   Social History Narrative   Patient drinks 1 cup of coffee daily.   Patient is right handed.      PHYSICAL EXAM  Filed Vitals:   06/28/15 1111  BP: 146/76  Pulse: 72  Resp: 16  Height: 5' (1.524 m)  Weight: 125 lb (56.7 kg)   Body mass index is 24.41 kg/(m^2).  Generalized: Well developed, in no acute distress   Neurological examination  Mentation: Alert oriented to time, place, history taking. Follows all commands speech and language fluent Cranial nerve II-XII: Pupils were equal round reactive to light. Extraocular movements were full, visual field were full on confrontational test. Facial sensation and strength were normal. Uvula tongue midline. Head turning and shoulder shrug  were normal and symmetric. Motor: The motor testing reveals 5 over 5 strength of all 4 extremities.Mild foot drop on the right. Good symmetric motor tone is noted throughout.  Sensory: Sensory testing is intact to soft touch on all 4 extremities but decreased on the right. No evidence of extinction is noted.  Coordination: Cerebellar testing reveals good finger-nose-finger and heel-to-shin bilaterally.  Gait and station: Patient is using a wheelchair today. With a walker she has decreased stride and very slow gait. Some difficulty with turns. Tandem gait not attempted. 25 foot timed walking test is 33.28 seconds Reflexes: Deep tendon reflexes are symmetric and normal bilaterally.   DIAGNOSTIC DATA (LABS, IMAGING, TESTING) - I reviewed patient records, labs, notes, testing and imaging myself where available.  Lab Results  Component Value Date   WBC 6.4 09/23/2014   HGB  14.4 12/03/2013   HCT 39.8 09/23/2014   MCV 84 09/23/2014   PLT 330 09/23/2014      Component Value Date/Time   NA 143 09/23/2014 1619   NA 138 05/08/2011 0512   K 3.9 09/23/2014 1619   CL 98 09/23/2014 1619   CO2 26 09/23/2014 1619   GLUCOSE 115* 09/23/2014 1619   GLUCOSE 125* 05/08/2011 0512   BUN 11 09/23/2014 1619   BUN 11 05/08/2011 0512   CREATININE 0.92 09/23/2014 1619   CALCIUM 9.4 09/23/2014 1619   PROT 6.5 09/23/2014 1619   PROT 7.5 05/03/2011 0936   ALBUMIN 4.0 09/23/2014 1619   ALBUMIN 4.1 05/03/2011 0936   AST 18 09/23/2014 1619   ALT 14 09/23/2014 1619   ALKPHOS 123* 09/23/2014 1619   BILITOT 0.4 09/23/2014 1619   BILITOT 0.6 12/03/2013 1018   GFRNONAA 63 09/23/2014 1619   GFRAA 73 09/23/2014 1619      ASSESSMENT AND PLAN 70 y.o. year old female  has a past medical history of Pneumonia; Arthritis; MS (multiple sclerosis) (HCC); Anxiety; Depression; Hypertension; Diverticulosis; Asthma; COPD (chronic obstructive pulmonary disease) (HCC); Chronic pain syndrome; Degenerative arthritis; Memory deficits (12/03/2013); Abnormality of gait (12/03/2013); UTI (urinary tract infection); Neurogenic bladder (02/01/2015); and Ulnar neuropathy at elbow (02/01/2015). here with:  1. Multiple sclerosis 2. Abnormality of gait  Overall the patient has remained stable. She continues to have difficulty with ambulation. She has participated in physical therapy in the past. I'm curious if ampyra would give her any benefit. I will check blood work today. The patient is interested in this medication if we think it would help her. I will wait for her blood work results. She was advised that if her symptoms worsen or she develops any new symptoms she she'll let us know. She will follow-up in 3 months or sooner if needed.  I spent 25 Minutes with the patient 50% of this time was spent counseling the patient on treatment with ampyra.  Butch Penny, MSN, NP-C 06/28/2015, 1:11 PM [facility] [address] [place], [place] [address] [phone]

## 2015-06-29 ENCOUNTER — Telehealth: Payer: Self-pay | Admitting: *Deleted

## 2015-06-29 LAB — COMPREHENSIVE METABOLIC PANEL
ALBUMIN: 4.6 g/dL (ref 3.5–4.8)
ALK PHOS: 88 IU/L (ref 39–117)
ALT: 32 IU/L (ref 0–32)
AST: 25 IU/L (ref 0–40)
Albumin/Globulin Ratio: 2.1 (ref 1.2–2.2)
BUN / CREAT RATIO: 26 (ref 12–28)
BUN: 19 mg/dL (ref 8–27)
Bilirubin Total: 1 mg/dL (ref 0.0–1.2)
CO2: 26 mmol/L (ref 18–29)
CREATININE: 0.73 mg/dL (ref 0.57–1.00)
Calcium: 9.6 mg/dL (ref 8.7–10.3)
Chloride: 101 mmol/L (ref 96–106)
GFR, EST AFRICAN AMERICAN: 96 mL/min/{1.73_m2} (ref >59)
GFR, EST NON AFRICAN AMERICAN: 84 mL/min/{1.73_m2} (ref >59)
GLOBULIN, TOTAL: 2.2 g/dL (ref 1.5–4.5)
GLUCOSE: 104 mg/dL — AB (ref 65–99)
Potassium: 5.1 mmol/L (ref 3.5–5.2)
SODIUM: 143 mmol/L (ref 134–144)
TOTAL PROTEIN: 6.8 g/dL (ref 6.0–8.5)

## 2015-06-29 LAB — CBC WITH DIFFERENTIAL/PLATELET
BASOS: 1 %
Basophils Absolute: 0 10*3/uL (ref 0.0–0.2)
EOS (ABSOLUTE): 0.2 10*3/uL (ref 0.0–0.4)
EOS: 4 %
HEMATOCRIT: 40 % (ref 34.0–46.6)
HEMOGLOBIN: 12.9 g/dL (ref 11.1–15.9)
Immature Grans (Abs): 0 10*3/uL (ref 0.0–0.1)
Immature Granulocytes: 0 %
LYMPHS ABS: 0.5 10*3/uL — AB (ref 0.7–3.1)
Lymphs: 12 %
MCH: 27.2 pg (ref 26.6–33.0)
MCHC: 32.3 g/dL (ref 31.5–35.7)
MCV: 84 fL (ref 79–97)
MONOCYTES: 9 %
MONOS ABS: 0.4 10*3/uL (ref 0.1–0.9)
NEUTROS ABS: 3.3 10*3/uL (ref 1.4–7.0)
Neutrophils: 74 %
Platelets: 249 10*3/uL (ref 150–379)
RBC: 4.74 x10E6/uL (ref 3.77–5.28)
RDW: 15.7 % — ABNORMAL HIGH (ref 12.3–15.4)
WBC: 4.4 10*3/uL (ref 3.4–10.8)

## 2015-06-29 NOTE — Telephone Encounter (Signed)
I called pt and relayed that her lab work was ok.  She verbalized understanding.

## 2015-06-29 NOTE — Telephone Encounter (Signed)
-----   Message from Butch Penny, NP sent at 06/29/2015  9:41 AM EDT ----- Lab work ok. Please call patient.

## 2015-07-14 ENCOUNTER — Telehealth: Payer: Self-pay

## 2015-07-14 NOTE — Telephone Encounter (Signed)
Spoke to pt. I advised her that her appt on 9/14 needs to be rescheduled since Clyde, NP is out of the office that day. Pt is agreeable to a 9/11 2:00 appt. Pt verbalized understanding of new appt date and time.

## 2015-07-27 ENCOUNTER — Telehealth: Payer: Self-pay | Admitting: Adult Health

## 2015-07-27 NOTE — Telephone Encounter (Signed)
Patient is calling to inquire about medication Ampyra. She says she was told she could get a 60 day supply free. Please call and discuss.

## 2015-07-28 NOTE — Telephone Encounter (Signed)
Pt calling interested in ampya.  The lab work was ok.  Proceed.  (do you recall filling out ampyra enrollment form)?

## 2015-07-28 NOTE — Telephone Encounter (Signed)
Faith do you still have this form? She filled it out in the office and we were waiting on lab work.

## 2015-07-29 ENCOUNTER — Encounter: Payer: Self-pay | Admitting: *Deleted

## 2015-08-01 NOTE — Telephone Encounter (Signed)
Faith can you send this form in?

## 2015-08-08 NOTE — Telephone Encounter (Signed)
I received ampyra support services p/w for [place].  They have received information and is in review since 1200.  Will fax approval and mail response to pt.  [phone].  Spoke to Amy.

## 2015-08-11 NOTE — Telephone Encounter (Signed)
I called and received approval for Ampyra 10mg  # 60 tabs good thru 02-04-2016.  [phone]  TR#[id number].

## 2015-09-26 ENCOUNTER — Ambulatory Visit: Payer: Self-pay | Admitting: Adult Health

## 2015-09-29 ENCOUNTER — Ambulatory Visit: Payer: Medicare PPO | Admitting: Adult Health

## 2015-10-06 ENCOUNTER — Encounter: Payer: Self-pay | Admitting: Adult Health

## 2015-10-06 ENCOUNTER — Ambulatory Visit (INDEPENDENT_AMBULATORY_CARE_PROVIDER_SITE_OTHER): Payer: Medicare PPO | Admitting: Adult Health

## 2015-10-06 VITALS — BP 137/74 | HR 74 | Ht 60.0 in | Wt 132.4 lb

## 2015-10-06 DIAGNOSIS — R269 Unspecified abnormalities of gait and mobility: Secondary | ICD-10-CM | POA: Diagnosis not present

## 2015-10-06 DIAGNOSIS — G35 Multiple sclerosis: Secondary | ICD-10-CM | POA: Diagnosis not present

## 2015-10-06 NOTE — Progress Notes (Signed)
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 She returns today for follow-up. At the last visit we started the patient on Ampyra. She reports that this has been beneficial for her walking. She states that she no longer uses a wheelchair. She uses a walker when ambulating. She has had 3 falls. She states that her balance is unsteady.. Denies any new numbness or weakness. No change in her vision. No change in the bowels or bladder. She continues to use oxybutynin. She remains on Tecfidera and is tolerating it well. She returns today for an evaluation.  HISTORY 06/28/15: Ms. Laural Young is a 70 year old female with a history of multiple sclerosis associated with a gait disorder. She returns today for follow-up. She reports overall she is doing well. She denies any changes with the bowels or bladder. She does state that she followed up with a urologist who put her on oxybutynin. Denies any changes with her vision. Denies any new numbness or weakness. She states most of her symptoms are on the right side. She does have a hard time ambulating. States her gait is very slow occasionally will drag the right foot. She uses a walker for short distances but will use a wheelchair for longer distances. Her daughter thinks that her walking is also affected by the fact that she gets nervous in crowds of people and her fear of falling. She returns today for an evaluation.  HISTORY 02/01/15 (WILLIS): Ms. Laural Young is a 70 year old right-handed white female with a history of multiple sclerosis associated with a gait disorder. The patient has had worsening of her physical condition following a severe bladder infection associated with sepsis in the summer of 2016. The patient has not fully recovered from this. She is walking  short distances with a walker, she has a tendency to lean backwards, she will fall on occasion. She has had significant issues with ongoing urinary frequency, she indicates that she gets up about 15 times at nighttime to urinate, still has urinary incontinence, and she will urinate every 2 hours during the day. The patient tries to limit her fluid intake in the evening hours. The patient reports some problems with right hand weakness that developed during her severe illness. The patient has decreased grip with the right hand and numbness of the fourth and fifth fingers. 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 The patient has not seen a urology physician for her bladder yet. Her primary care physician is getting this set up. The patient returns for an evaluation. She is on Tecfidera, she is tolerating medication well at this time. She has had recent blood work done in December 2016, this is unremarkable. The patient does have some memory issues at baseline, this has not significantly worsened over time  REVIEW OF SYSTEMS: Out of a complete 14 system review of symptoms, the patient complains only of the following symptoms, and all other reviewed systems are negative.  ALLERGIES: Allergies  Allergen Reactions  . Aubagio [Teriflunomide]     Increased LFT's  . Penicillins Rash    HOME MEDICATIONS: Outpatient Medications Prior to Visit  Medication Sig Dispense Refill  . albuterol (PROAIR HFA) 108 (90 BASE) MCG/ACT inhaler Inhale into the lungs.  Reported on 06/28/2015    . ALPRAZolam (XANAX) 0.5 MG tablet Take 0.25 mg by mouth 3 (three) times daily.     Marland Kitchen amitriptyline (ELAVIL) 50 MG tablet Take 100 mg by mouth at bedtime.    Marland Kitchen amLODipine (NORVASC) 5 MG tablet Take 5 mg by mouth daily.    . bisacodyl (DULCOLAX) 5 MG EC tablet Take 10 mg by mouth daily as needed for moderate constipation.    . celecoxib  (CELEBREX) 200 MG capsule Take 200 mg by mouth daily.    . cyanocobalamin 500 MCG tablet Take by mouth.    . dalfampridine 10 MG TB12 Take 10 mg by mouth 2 (two) times daily.    . Dimethyl Fumarate (TECFIDERA) 240 MG CPDR Take 1 capsule (240 mg total) by mouth 2 (two) times daily. 60 capsule 11  . docusate sodium (COLACE) 100 MG capsule Take 100 mg by mouth 2 (two) times daily. Reported on 06/28/2015    . donepezil (ARICEPT) 5 MG tablet Take 5 mg by mouth at bedtime.    . ergotamine-caffeine (CAFERGOT) 1-100 MG per tablet Take 1 tablet by mouth daily. Reported on 06/28/2015    . FLUoxetine (PROZAC) 40 MG capsule Take 40 mg by mouth daily. Reported on 02/01/2015    . gabapentin (NEURONTIN) 300 MG capsule Take 600 mg by mouth 2 (two) times daily.     Marland Kitchen lisinopril (PRINIVIL,ZESTRIL) 20 MG tablet Take 20 mg by mouth daily. Reported on 06/28/2015    . methadone (DOLOPHINE) 5 MG tablet TK 1 T PO QID  0  . mirabegron ER (MYRBETRIQ) 50 MG TB24 tablet Take 50 mg by mouth daily.    . mupirocin ointment (BACTROBAN) 2 % Reported on 06/28/2015  2  . pregabalin (LYRICA) 50 MG capsule Take 50 mg by mouth 2 (two) times daily.    . Teriflunomide 14 MG TABS Take 14 mg by mouth daily. (Aubagio) (Patient not taking: Reported on 06/28/2015) 30 tablet 6  . tolterodine (DETROL LA) 2 MG 24 hr capsule Take 2 mg by mouth daily. Reported on 06/28/2015     No facility-administered medications prior to visit.     PAST MEDICAL HISTORY: Past Medical History:  Diagnosis Date  . Abnormality of gait 12/03/2013  . Anxiety   . Arthritis   . Asthma   . Chronic pain syndrome   . COPD (chronic obstructive pulmonary disease) (HCC)   . Degenerative arthritis   . Depression   . Diverticulosis   . Hypertension   . Memory deficits 12/03/2013  . MS (multiple sclerosis) (HCC)   . Neurogenic bladder 02/01/2015  . Pneumonia    over 5 yrs ago  . 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 UTI (urinary tract infection)     PAST  SURGICAL HISTORY: Past Surgical History:  Procedure Laterality Date  . ABDOMINAL HYSTERECTOMY    . APPENDECTOMY    . BACK SURGERY     lumbar   x 3 surgeries  . CATARACT EXTRACTION Bilateral   . CHOLECYSTECTOMY    . SHOULDER HEMI-ARTHROPLASTY  05/07/2011   Procedure: SHOULDER HEMI-ARTHROPLASTY;  Surgeon: Raymon Mutton, MD;  Location: [facility];  Service: Orthopedics;  Laterality: Left;  . TONSILLECTOMY    . TUBAL LIGATION Bilateral     FAMILY HISTORY: Family History  Problem Relation Age of Onset  . Stroke Mother   . Dementia Mother   . Heart attack Father   . Heart failure Sister   . Diabetes  Brother   . Heart disease Brother     SOCIAL HISTORY: Social History   Social History  . Marital status: Married    Spouse name: N/A  . Number of children: 1  . Years of education: 34   Occupational History  . retired    Social History Main Topics  . Smoking status: Never Smoker  . Smokeless tobacco: Never Used  . Alcohol use No  . Drug use: No  . Sexual activity: Not on file   Other Topics Concern  . Not on file   Social History Narrative   Patient drinks 1 cup of coffee daily.   Patient is right handed.      PHYSICAL EXAM  Vitals:   10/06/15 1546  BP: 137/74  Pulse: 74  Weight: 132 lb 6.4 oz (60.1 kg)  Height: 5' (1.524 m)   Body mass index is 25.86 kg/m.  Generalized: Well developed, in no acute distress   Neurological examination  Mentation: Alert oriented to time, place, history taking. Follows all commands speech and language fluent Cranial nerve II-XII: Pupils were equal round reactive to light. Extraocular movements were full, visual field were full on confrontational test. Facial sensation and strength were normal. Uvula tongue midline. Head turning and shoulder shrug  were normal and symmetric. Motor: The motor testing reveals 5 over 5 strength In all extremities except the right lower extremity which is 4/5. Good symmetric motor tone is noted  throughout.  Sensory: Sensory testing is intact to soft touch on all 4 extremities. No evidence of extinction is noted.  Coordination: Cerebellar testing reveals good finger-nose-finger and heel-to-shin bilaterally.  Gait and station: Patient uses a walker when ambulating. She has difficulty with turns. 25 foot timed walking test 23 seconds Reflexes: Deep tendon reflexes are symmetric and normal bilaterally.   DIAGNOSTIC DATA (LABS, IMAGING, TESTING) - I reviewed patient records, labs, notes, testing and imaging myself where available.  Lab Results  Component Value Date   WBC 4.4 06/28/2015   HGB 14.4 12/03/2013   HCT 40.0 06/28/2015   MCV 84 06/28/2015   PLT 249 06/28/2015      Component Value Date/Time   NA 143 06/28/2015 1239   K 5.1 06/28/2015 1239   CL 101 06/28/2015 1239   CO2 26 06/28/2015 1239   GLUCOSE 104 (H) 06/28/2015 1239   GLUCOSE 125 (H) 05/08/2011 0512   BUN 19 06/28/2015 1239   CREATININE 0.73 06/28/2015 1239   CALCIUM 9.6 06/28/2015 1239   PROT 6.8 06/28/2015 1239   ALBUMIN 4.6 06/28/2015 1239   AST 25 06/28/2015 1239   ALT 32 06/28/2015 1239   ALKPHOS 88 06/28/2015 1239   BILITOT 1.0 06/28/2015 1239   GFRNONAA 84 06/28/2015 1239   GFRAA 96 06/28/2015 1239      ASSESSMENT AND PLAN 70 y.o. year old female  has a past medical history of Abnormality of gait (12/03/2013); Anxiety; Arthritis; Asthma; Chronic pain syndrome; COPD (chronic obstructive pulmonary disease) (HCC); Degenerative arthritis; Depression; Diverticulosis; Hypertension; Memory deficits (12/03/2013); MS (multiple sclerosis) (HCC); Neurogenic bladder (02/01/2015); Pneumonia; Ulnar neuropathy at elbow (02/01/2015); and UTI (urinary tract infection). here with:  1. Multiple sclerosis 2. Abnormality of gait  Overall the patient is doing well. She will continue on Tecfidera and ampyra. Blood work in August was relatively unremarkable. We'll refer the patient for physical therapy for gait and  balance training. Patient advised that her symptoms worsen or she develops any new symptoms she will let us know. Follow-up in  6 months or sooner if needed.    Butch Penny, MSN, NP-C 10/06/2015, 3:47 PM [facility] [address], [address] [place], [place] [address] [phone]

## 2015-10-06 NOTE — Patient Instructions (Signed)
Continue Tecfidera and Ampyra Referral to Physical therapy If your symptoms worsen or you develop new symptoms please let us know.

## 2015-10-06 NOTE — Progress Notes (Signed)
I have read the note, and I agree with the clinical assessment and plan.  Casey Young,Casey Young   

## 2015-12-27 ENCOUNTER — Telehealth: Payer: Self-pay | Admitting: *Deleted

## 2015-12-27 NOTE — Telephone Encounter (Signed)
[facility]  I tried to call pt and her VM not set up.  I see no documentation of this on our EMR.  Last seen 10-06-2015 and was taking this medication.

## 2015-12-29 NOTE — Telephone Encounter (Signed)
I called pt.  She had not heard my previous message.  I told her that we received letter from [employer] that the pt had an adverse drug event following treatment with Ampyra.  Pt states that she had not had an adverse event with ampyra.  She is not walking like she thought she would, but no SE from it.    I will forward back to them case reference  [id number].

## 2016-01-11 ENCOUNTER — Telehealth: Payer: Self-pay

## 2016-01-11 NOTE — Telephone Encounter (Signed)
Authorization good until 01/14/17. Call [employer] at [phone] if questions.

## 2016-02-21 NOTE — Telephone Encounter (Signed)
Received approval for ampyra from [facility] ID [id number].  Authorization good until 02-17-2018.  [phone].

## 2016-04-06 ENCOUNTER — Telehealth: Payer: Self-pay | Admitting: Neurology

## 2016-04-06 ENCOUNTER — Ambulatory Visit (INDEPENDENT_AMBULATORY_CARE_PROVIDER_SITE_OTHER): Payer: Medicare PPO | Admitting: Neurology

## 2016-04-06 ENCOUNTER — Encounter: Payer: Self-pay | Admitting: Neurology

## 2016-04-06 VITALS — BP 129/73 | HR 75 | Ht 60.0 in

## 2016-04-06 DIAGNOSIS — R269 Unspecified abnormalities of gait and mobility: Secondary | ICD-10-CM | POA: Diagnosis not present

## 2016-04-06 DIAGNOSIS — R413 Other amnesia: Secondary | ICD-10-CM

## 2016-04-06 DIAGNOSIS — G20 Parkinson's disease: Secondary | ICD-10-CM | POA: Diagnosis not present

## 2016-04-06 DIAGNOSIS — G35 Multiple sclerosis: Secondary | ICD-10-CM

## 2016-04-06 DIAGNOSIS — Z5181 Encounter for therapeutic drug level monitoring: Secondary | ICD-10-CM

## 2016-04-06 MED ORDER — CARBIDOPA-LEVODOPA 25-100 MG PO TABS: 1.0000 | ORAL_TABLET | Freq: Three times a day (TID) | ORAL | 2 refills | Status: DC

## 2016-04-06 NOTE — Progress Notes (Signed)
Reason for visit: Multiple sclerosis  Casey Young is an 71 y.o. female  History of present illness:  Casey Young is a 72 year old right-handed white female with a history of multiple sclerosis associated with a gait disorder. The patient also reports some problems with memory. She was recently in the hospital on 01/16/2016 with a urinary tract infection associated with onset of confusion. The patient underwent a stroke workup with a CT scan of the brain that was unremarkable, CT angiogram was done as well as a 2-D echocardiogram. These studies were brought for my review. The patient has returned back to her baseline after the urinary tract infection was treated, she currently is in physical therapy. She is able to walk with a walker in the home environment, she uses a wheelchair outside of the house. The patient has had about 3 or 4 falls over the last 6 months. She will note that she will fall backwards if she looks up, she feels somewhat dizzy when this happens. The patient has been placed on Ampyra with good improvement in her ability to walk. The patient has begun to develop problems with leaning to the left, she is drooling at times, particularly while sleeping. The patient has vivid dreams at night and she will freeze when she tries to walk in enclosed areas. The patient has developed a soft whispery voice at times, family members have difficulty hearing her. She does have tremors, but they occur when she uses her arms, not at rest. The patient returns for an evaluation. She remains on Tecfidera, tolerating the medication well. No new numbness, weakness, or vision changes have been noted. The patient does have a neurogenic bladder, she takes vitamin C tablets 500 mg daily.  Past Medical History:  Diagnosis Date  . Abnormality of gait 12/03/2013  . Anxiety   . Arthritis   . Asthma   . Chronic pain syndrome   . COPD (chronic obstructive pulmonary disease) (HCC)   . Degenerative  arthritis   . Depression   . Diverticulosis   . Hypertension   . Memory deficits 12/03/2013  . MS (multiple sclerosis) (HCC)   . Neurogenic bladder 02/01/2015  . Pneumonia    over 5 yrs ago  . 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 UTI (urinary tract infection)     Past Surgical History:  Procedure Laterality Date  . ABDOMINAL HYSTERECTOMY    . APPENDECTOMY    . BACK SURGERY     lumbar   x 3 surgeries  . CATARACT EXTRACTION Bilateral   . CHOLECYSTECTOMY    . SHOULDER HEMI-ARTHROPLASTY  05/07/2011   Procedure: SHOULDER HEMI-ARTHROPLASTY;  Surgeon: Raymon Mutton, MD;  Location: [facility] OR;  Service: Orthopedics;  Laterality: Left;  . TONSILLECTOMY    . TUBAL LIGATION Bilateral     Family History  Problem Relation Age of Onset  . Stroke Mother   . Dementia Mother   . Heart attack Father   . Heart failure Sister   . Diabetes Brother   . Heart disease Brother     Social history:  reports that she has never smoked. She has never used smokeless tobacco. She reports that she does not drink alcohol or use drugs.    Allergies  Allergen Reactions  . Aubagio [Teriflunomide]     Increased LFT's  . Penicillins Rash    Medications:  Prior to Admission medications   Medication Sig Start Date End Date Taking? Authorizing Provider  albuterol (  PROAIR HFA) 108 (90 BASE) MCG/ACT inhaler Inhale into the lungs. Reported on 06/28/2015   Yes Historical Provider, MD  amitriptyline (ELAVIL) 50 MG tablet Take 100 mg by mouth at bedtime.   Yes Historical Provider, MD  amLODipine (NORVASC) 5 MG tablet Take 5 mg by mouth daily.   Yes Historical Provider, MD  aspirin 81 MG tablet Take 81 mg by mouth daily.   Yes Historical Provider, MD  bisacodyl (DULCOLAX) 5 MG EC tablet Take 10 mg by mouth daily as needed for moderate constipation.   Yes Historical Provider, MD  celecoxib (CELEBREX) 200 MG capsule Take 200 mg by mouth daily.   Yes Historical Provider, MD  cyanocobalamin 500 MCG tablet  Take by mouth.   Yes Historical Provider, MD  dalfampridine 10 MG TB12 Take 10 mg by mouth 2 (two) times daily.   Yes Historical Provider, MD  Dimethyl Fumarate (TECFIDERA) 240 MG CPDR Take 1 capsule (240 mg total) by mouth 2 (two) times daily. 05/11/15  Yes York Spaniel, MD  docusate sodium (COLACE) 100 MG capsule Take 100 mg by mouth 2 (two) times daily. Reported on 06/28/2015   Yes Historical Provider, MD  donepezil (ARICEPT) 5 MG tablet Take 10 mg by mouth at bedtime.    Yes Historical Provider, MD  DULoxetine (CYMBALTA) 60 MG capsule Take 60 mg by mouth daily.   Yes Historical Provider, MD  ergotamine-caffeine (CAFERGOT) 1-100 MG per tablet Take 1 tablet by mouth daily. Reported on 06/28/2015   Yes Historical Provider, MD  gabapentin (NEURONTIN) 300 MG capsule Take 600 mg by mouth 2 (two) times daily.    Yes Historical Provider, MD  lisinopril (PRINIVIL,ZESTRIL) 20 MG tablet Take 20 mg by mouth daily. Reported on 06/28/2015   Yes Historical Provider, MD  methadone (DOLOPHINE) 5 MG tablet TK 1 T PO QID 01/10/15  Yes Historical Provider, MD  mirabegron ER (MYRBETRIQ) 50 MG TB24 tablet Take 50 mg by mouth daily.   Yes Historical Provider, MD  tolterodine (DETROL LA) 2 MG 24 hr capsule Take 2 mg by mouth daily. Reported on 06/28/2015   Yes Historical Provider, MD  carbidopa-levodopa (SINEMET IR) 25-100 MG tablet Take 1 tablet by mouth 3 (three) times daily. 04/06/16   York Spaniel, MD    ROS:  Out of a complete 14 system review of symptoms, the patient complains only of the following symptoms, and all other reviewed systems are negative.  Fatigue Eye pain Constipation Insomnia, frequent waking Frequency of urination Joint pain, back pain, walking difficulty Memory problems, weakness, tremors Depression  Blood pressure 129/73, pulse 75, height 5' (1.524 m), SpO2 96 %.  Physical Exam  General: The patient is alert and cooperative at the time of the examination.  Skin: No  significant peripheral edema is noted.   Neurologic Exam  Mental status: The patient is alert and oriented x 3 at the time of the examination. The patient has apparent normal recent and remote memory, with an apparently normal attention span and concentration ability.   Cranial nerves: Facial symmetry is present. Speech is normal, no aphasia or dysarthria is noted. Extraocular movements are full. Visual fields are full. Masking of the face is seen. Pupils are equal, round, and reactive to light. Discs are flat bilaterally.  Motor: The patient has good strength in all 4 extremities.  Sensory examination: Soft touch sensation is symmetric on the arms, more prominent as are noted on the right face and right leg than the left side.  Coordination: The patient has good finger-nose-finger and heel-to-shin bilaterally.  Gait and station: The patient is able to walk with assistance, she has a tendency to freeze slightly with turns, particularly with the left leg. Romberg is negative. No drift is seen. With sitting, the patient leans to the left.  Reflexes: Deep tendon reflexes are symmetric.   Assessment/Plan:  1. Multiple sclerosis  2. Gait disorder  3. Neurogenic bladder  4. Parkinsonism, possible Parkinson's disease  The patient seems to be developing features of Parkinson's disease, we will give her trial on Sinemet 25/100 mg tablets taking one half tablet 3 times daily for one month, then take 1 full tablet 3 times daily. She will continue on the Tecfidera, blood work will be done today, she will follow-up in 4 months. The patient currently is in physical therapy for her walking.  Marlan Palau MD 04/06/2016 12:00 PM  [facility] [address] [place], [place] [address]  Phone [phone] Fax [phone]

## 2016-04-06 NOTE — Patient Instructions (Signed)
   We will start sinemet 25/100 mg tablet 1/2 tablet three times a day for 4 weeks, then take one tablet three times a day.  Sinemet (carbidopa) may result in confusion or hallucinations, drowsiness, nausea, or dizziness. If any significant side effects are noted, please contact our office. Sinemet may not be well absorbed when taken with high protein meals, if tolerated it is best to take 30-45 minutes before you eat.

## 2016-04-06 NOTE — Telephone Encounter (Signed)
Spoke with Lowella Bandy at check out.  Advised we can offer 08/06/16 at 12pm, double book. She will call patient and let me know if this works

## 2016-04-06 NOTE — Telephone Encounter (Signed)
Ander Gaster, wanted to let you know the soonest opening I saw for pt's 4 mo apt was 9/5. Is there another spot we could use to get her in sooner?

## 2016-04-07 LAB — CBC WITH DIFFERENTIAL/PLATELET
BASOS: 0 %
Basophils Absolute: 0 10*3/uL (ref 0.0–0.2)
EOS (ABSOLUTE): 0.1 10*3/uL (ref 0.0–0.4)
EOS: 3 %
HEMATOCRIT: 38.4 % (ref 34.0–46.6)
HEMOGLOBIN: 13.3 g/dL (ref 11.1–15.9)
Immature Grans (Abs): 0 10*3/uL (ref 0.0–0.1)
Immature Granulocytes: 0 %
LYMPHS ABS: 0.5 10*3/uL — AB (ref 0.7–3.1)
Lymphs: 11 %
MCH: 29 pg (ref 26.6–33.0)
MCHC: 34.6 g/dL (ref 31.5–35.7)
MCV: 84 fL (ref 79–97)
MONOCYTES: 7 %
MONOS ABS: 0.3 10*3/uL (ref 0.1–0.9)
Neutrophils Absolute: 3.8 10*3/uL (ref 1.4–7.0)
Neutrophils: 79 %
Platelets: 252 10*3/uL (ref 150–379)
RBC: 4.59 x10E6/uL (ref 3.77–5.28)
RDW: 14.3 % (ref 12.3–15.4)
WBC: 4.9 10*3/uL (ref 3.4–10.8)

## 2016-04-07 LAB — COMPREHENSIVE METABOLIC PANEL
ALBUMIN: 4.5 g/dL (ref 3.5–4.8)
ALT: 18 IU/L (ref 0–32)
AST: 18 IU/L (ref 0–40)
Albumin/Globulin Ratio: 2.1 (ref 1.2–2.2)
Alkaline Phosphatase: 75 IU/L (ref 39–117)
BUN / CREAT RATIO: 27 (ref 12–28)
BUN: 20 mg/dL (ref 8–27)
Bilirubin Total: 0.9 mg/dL (ref 0.0–1.2)
CALCIUM: 9.6 mg/dL (ref 8.7–10.3)
CO2: 29 mmol/L (ref 18–29)
CREATININE: 0.75 mg/dL (ref 0.57–1.00)
Chloride: 101 mmol/L (ref 96–106)
GFR calc Af Amer: 93 mL/min/{1.73_m2} (ref >59)
GFR, EST NON AFRICAN AMERICAN: 80 mL/min/{1.73_m2} (ref >59)
GLOBULIN, TOTAL: 2.1 g/dL (ref 1.5–4.5)
Glucose: 81 mg/dL (ref 65–99)
Potassium: 4.8 mmol/L (ref 3.5–5.2)
SODIUM: 145 mmol/L — AB (ref 134–144)
Total Protein: 6.6 g/dL (ref 6.0–8.5)

## 2016-04-09 ENCOUNTER — Telehealth: Payer: Self-pay | Admitting: *Deleted

## 2016-04-09 NOTE — Telephone Encounter (Signed)
Called and spoke with pt about lab results per CW,MD note. She verbalized understanding.

## 2016-04-09 NOTE — Telephone Encounter (Signed)
Called and spoke to pt. Scheduled f/u for 08/06/16 at 12pm, check in 1130am. Pt agreeable to this.

## 2016-04-09 NOTE — Telephone Encounter (Signed)
-----   Message from York Spaniel, MD sent at 04/08/2016  8:31 AM EDT ----- Blood work is unremarkable with exception of a very minimally elevated sodium level and a stable absolute lymphocyte count of 0.5. No change in therapy for now. Please call the patient. ----- Message ----- From: Nell Range Lab Results In Sent: 04/07/2016   5:41 AM To: York Spaniel, MD

## 2016-04-23 ENCOUNTER — Telehealth: Payer: Self-pay | Admitting: Neurology

## 2016-04-23 MED ORDER — CARBIDOPA-LEVODOPA ER 23.75-95 MG PO CPCR: 1.0000 | ORAL_CAPSULE | Freq: Two times a day (BID) | ORAL | 2 refills | Status: DC

## 2016-04-23 NOTE — Addendum Note (Signed)
Addended by: York Spaniel on: 04/23/2016 01:09 PM   Modules accepted: Orders

## 2016-04-23 NOTE — Telephone Encounter (Signed)
I called patient. The patient is taking half of a 25/100 mg Sinemet tablet 3 times a day, she is taking this with meals and still having severe nausea, she is losing weight.  The patient will discontinue the short-acting Sinemet, we will try low-dose Rytary with food.

## 2016-04-23 NOTE — Telephone Encounter (Signed)
Pt called and said that carbidopa-levodopa (SINEMET IR) 25-100 MG tablet makes her nauseated, she said she has lost about 10 pounds since she has been on it and would like to request something else.

## 2016-04-24 ENCOUNTER — Other Ambulatory Visit: Payer: Self-pay | Admitting: Neurology

## 2016-04-24 ENCOUNTER — Encounter: Payer: Self-pay | Admitting: *Deleted

## 2016-04-24 MED ORDER — CARBIDOPA-LEVODOPA ER 25-100 MG PO TBCR: 0.5000 | EXTENDED_RELEASE_TABLET | Freq: Two times a day (BID) | ORAL | 2 refills | Status: DC

## 2016-04-24 NOTE — Progress Notes (Signed)
We could potentially try low-dose extended release tablet first, I will call this in now.

## 2016-04-24 NOTE — Progress Notes (Signed)
Initiated PA Rytary on covermymeds. Questionnaire submitted. PA Case [id number] Status: Pending review Key: [id number]

## 2016-04-24 NOTE — Progress Notes (Signed)
Called pt. Relayed that Rytary not covered until she has tried other medications insurance has listed on her formulary. Advised CW,MD called in carbidopa-levodopa ER. Went over directions for use. She verbalized understanding and agreeable with plan. She will call if she has any SE or further questions or concerns.   Called [facility] and advised them to cx rx rytary. CW,MD called in new rx today for carbidopa-levodopa ER 25-100mg  tablet. Spoke with [employer]. She will get new rx ready for patient. This will only cost patient $3.53.   Also faxed notification to insurance company that we changed to medication on pt formulary list. No need to continue with PA for rytary. Fax: [phone]. Received confirmation.

## 2016-05-02 ENCOUNTER — Other Ambulatory Visit: Payer: Self-pay | Admitting: *Deleted

## 2016-05-02 MED ORDER — DIMETHYL FUMARATE 240 MG PO CPDR: 1.0000 | DELAYED_RELEASE_CAPSULE | Freq: Two times a day (BID) | ORAL | 11 refills | Status: DC

## 2016-07-16 ENCOUNTER — Telehealth: Payer: Self-pay | Admitting: Neurology

## 2016-07-16 NOTE — Telephone Encounter (Signed)
Ampyra has no correlation w/ Parkinson's.  Dr. Anne Hahn is aware I am making this call back.

## 2016-07-16 NOTE — Telephone Encounter (Signed)
Ralph/Accorda Wallace Cullens [phone]  Ref#  [id number] called wanting to know if the pt was on ampyra when she was dx with parkinsons and if the provider thinks there is any correlation. Please call

## 2016-07-16 NOTE — Telephone Encounter (Signed)
Spoke to Bovina - he has been informed.

## 2016-07-23 ENCOUNTER — Other Ambulatory Visit: Payer: Self-pay | Admitting: Neurology

## 2016-07-23 MED ORDER — DALFAMPRIDINE ER 10 MG PO TB12: 10.0000 mg | ORAL_TABLET | Freq: Two times a day (BID) | ORAL | 3 refills | Status: DC

## 2016-08-06 ENCOUNTER — Ambulatory Visit (INDEPENDENT_AMBULATORY_CARE_PROVIDER_SITE_OTHER): Payer: Medicare PPO | Admitting: Neurology

## 2016-08-06 ENCOUNTER — Encounter: Payer: Self-pay | Admitting: Neurology

## 2016-08-06 VITALS — BP 167/87 | HR 82 | Ht 60.0 in | Wt 115.5 lb

## 2016-08-06 DIAGNOSIS — R413 Other amnesia: Secondary | ICD-10-CM | POA: Diagnosis not present

## 2016-08-06 DIAGNOSIS — R269 Unspecified abnormalities of gait and mobility: Secondary | ICD-10-CM

## 2016-08-06 DIAGNOSIS — G35 Multiple sclerosis: Secondary | ICD-10-CM | POA: Diagnosis not present

## 2016-08-06 DIAGNOSIS — G20 Parkinson's disease: Secondary | ICD-10-CM

## 2016-08-06 MED ORDER — CARBIDOPA-LEVODOPA ER 25-100 MG PO TBCR: 1.0000 | EXTENDED_RELEASE_TABLET | Freq: Two times a day (BID) | ORAL | 3 refills | Status: DC

## 2016-08-06 NOTE — Progress Notes (Signed)
Reason for visit: Multiple sclerosis, Parkinson's disease  Casey Young is an 71 y.o. female  History of present illness:  Casey Young is a 71 year old right-handed white female with a long-standing history of multiple sclerosis. More recently, she has developed a shuffling gait, whispery voice, drooling, difficulty with freezing with walking. The patient has been placed on Sinemet, but she could not tolerate the short acting drug, she was switched to extended release Sinemet taking one half of a 25/100 mg tablet twice daily, she has been able to tolerate this. The patient has vivid dreams at night on occasion. She is on amitriptyline in the evening hours as well. She has fallen on occasion, she fell yesterday even while using her walker when she tried to make a turn. She has severe scoliosis, she leans to the left slightly because of this. She has a whispery voice, she denies any recent difficulty swallowing per se. She returns for an evaluation. She is on Tecfidera, she is tolerating the medication well.    Past Medical History:  Diagnosis Date  . Abnormality of gait 12/03/2013  . Anxiety   . Arthritis   . Asthma   . Chronic pain syndrome   . COPD (chronic obstructive pulmonary disease) (HCC)   . Degenerative arthritis   . Depression   . Diverticulosis   . Hypertension   . Memory deficits 12/03/2013  . MS (multiple sclerosis) (HCC)   . Neurogenic bladder 02/01/2015  . Pneumonia    over 5 yrs ago  . 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 UTI (urinary tract infection)     Past Surgical History:  Procedure Laterality Date  . ABDOMINAL HYSTERECTOMY    . APPENDECTOMY    . BACK SURGERY     lumbar   x 3 surgeries  . CATARACT EXTRACTION Bilateral   . CHOLECYSTECTOMY    . SHOULDER HEMI-ARTHROPLASTY  05/07/2011   Procedure: SHOULDER HEMI-ARTHROPLASTY;  Surgeon: Raymon Mutton, MD;  Location: [facility] OR;  Service: Orthopedics;  Laterality: Left;  . TONSILLECTOMY    . TUBAL  LIGATION Bilateral     Family History  Problem Relation Age of Onset  . Stroke Mother   . Dementia Mother   . Heart attack Father   . Heart failure Sister   . Diabetes Brother   . Heart disease Brother     Social history:  reports that she has never smoked. She has never used smokeless tobacco. She reports that she does not drink alcohol or use drugs.    Allergies  Allergen Reactions  . Aubagio [Teriflunomide]     Increased LFT's  . Penicillins Rash    Medications:  Prior to Admission medications   Medication Sig Start Date End Date Taking? Authorizing Provider  albuterol (PROAIR HFA) 108 (90 BASE) MCG/ACT inhaler Inhale into the lungs. Reported on 06/28/2015   Yes [provider]  amitriptyline (ELAVIL) 50 MG tablet Take 100 mg by mouth at bedtime.   Yes [provider]  amLODipine (NORVASC) 5 MG tablet Take 5 mg by mouth daily.   Yes [provider]  aspirin 81 MG tablet Take 81 mg by mouth daily.   Yes [provider]  bisacodyl (DULCOLAX) 5 MG EC tablet Take 10 mg by mouth daily as needed for moderate constipation.   Yes [provider]  Carbidopa-Levodopa ER (SINEMET CR) 25-100 MG tablet controlled release Take 0.5 tablets by mouth 2 (two) times daily. 04/24/16  Yes Willis,  Hennie Duos, MD  celecoxib (CELEBREX) 200 MG capsule Take 200 mg by mouth daily.   Yes [provider]  cyanocobalamin 500 MCG tablet Take by mouth.   Yes [provider]  dalfampridine (AMPYRA) 10 MG TB12 Take 1 tablet (10 mg total) by mouth 2 (two) times daily. 07/23/16  Yes York Spaniel, MD  Dimethyl Fumarate (TECFIDERA) 240 MG CPDR Take 1 capsule (240 mg total) by mouth 2 (two) times daily. 05/10/16  Yes York Spaniel, MD  docusate sodium (COLACE) 100 MG capsule Take 100 mg by mouth 2 (two) times daily. Reported on 06/28/2015   Yes [provider]  donepezil (ARICEPT) 5 MG tablet Take 10 mg by mouth at bedtime.    Yes [provider]  DULoxetine (CYMBALTA) 60 MG capsule Take 60 mg by mouth daily.   Yes [provider]  ergotamine-caffeine (CAFERGOT) 1-100 MG per tablet Take 1 tablet by mouth daily. Reported on 06/28/2015   Yes [provider]  gabapentin (NEURONTIN) 300 MG capsule Take 600 mg by mouth 2 (two) times daily.    Yes [provider]  lisinopril (PRINIVIL,ZESTRIL) 20 MG tablet Take 20 mg by mouth daily. Reported on 06/28/2015   Yes [provider]  methadone (DOLOPHINE) 5 MG tablet TK 1 T PO QID 01/10/15  Yes [provider]  mirabegron ER (MYRBETRIQ) 50 MG TB24 tablet Take 50 mg by mouth daily.   Yes [provider]  tolterodine (DETROL LA) 2 MG 24 hr capsule Take 2 mg by mouth daily. Reported on 06/28/2015   Yes [provider]    ROS:  Out of a complete 14 system review of symptoms, the patient complains only of the following symptoms, and all other reviewed systems are negative.  Gait disorder Drooling  Blood pressure (!) 167/87, pulse 82, height 5' (1.524 m), weight 115 lb 8 oz (52.4 kg).  Physical Exam  General: The patient is alert and cooperative at the time of the examination.  Skin: No significant peripheral edema is noted.   Neurologic Exam  Mental status: The patient is alert and oriented x 3 at the time of the examination. The patient has apparent normal recent and remote memory, with an apparently normal attention span and concentration ability.   Cranial nerves: Facial symmetry is present. Speech is normal, no aphasia or dysarthria is noted. Extraocular movements are full. Visual fields are full. Masking of the face is seen.  Motor: The patient has good strength in all 4 extremities.  Sensory examination: Soft touch sensation is symmetric on the face, arms, and legs.  Coordination: The patient has good finger-nose-finger and heel-to-shin bilaterally.  Gait and station: The patient requires some assistance  with standing. She is able to walk with assistance, she will freeze with turns. Posture is stooped. Tandem gait was not attempted. Romberg is positive, the patient will drift to the left.  Reflexes: Deep tendon reflexes are symmetric.   Assessment/Plan:  1. Multiple sclerosis  2. Parkinson's disease  3. Gait disorder  The patient will be increased on Sinemet CR taking the 25/100 mg tablet twice daily, we will eventually try to get up to 3 times a day. She will call for dose adjustments. She is doing well on the Tecfidera, she had blood work done recently through her primary care physician. We will follow-up in about 4 months.  Marlan Palau MD 08/06/2016 12:40 PM  [facility] [address] [place], [place] [address]  Phone [phone] Fax [phone]  

## 2016-08-06 NOTE — Patient Instructions (Signed)
   We will go up on the Sinemet ER 25/100 mg tablet taking one full tablet twice a day.  Sinemet (carbidopa) may result in confusion or hallucinations, drowsiness, nausea, or dizziness. If any significant side effects are noted, please contact our office. Sinemet may not be well absorbed when taken with high protein meals, if tolerated it is best to take 30-45 minutes before you eat.

## 2016-12-10 ENCOUNTER — Ambulatory Visit: Payer: Medicare PPO | Admitting: Neurology

## 2016-12-10 ENCOUNTER — Encounter: Payer: Self-pay | Admitting: Neurology

## 2016-12-10 VITALS — BP 189/90 | HR 84 | Ht 60.0 in | Wt 110.0 lb

## 2016-12-10 DIAGNOSIS — Z5181 Encounter for therapeutic drug level monitoring: Secondary | ICD-10-CM

## 2016-12-10 MED ORDER — CARBIDOPA-LEVODOPA-ENTACAPONE 25-100-200 MG PO TABS: 1.0000 | ORAL_TABLET | Freq: Three times a day (TID) | ORAL | 5 refills | Status: DC

## 2016-12-10 NOTE — Patient Instructions (Signed)
   We will stop the carbidopa tablets and go to Stalevo 25/100/200 tablets taking one three times a day with food.

## 2016-12-10 NOTE — Progress Notes (Signed)
Reason for visit: Multiple sclerosis, Parkinson's disease  Casey Young is an 71 y.o. female  History of present illness:  Casey Young is a 71 year old right-handed white female with a history of multiple sclerosis treated with Tecfidera.  The patient is tolerating this medication fairly well, she has not noted any new numbness or weakness of the face, arms, or legs.  The patient does have a gait disorder associated with her Parkinson's disease, she has fallen on several occasions, she is now using a stand-up walker that is helpful for her.  The patient has been on extended release Sinemet taking 25/100 mg tablets twice daily.  The patient gets nausea with this, even when she takes it with food.  The patient does have a reduction in appetite in general.  She is on chronic methadone dosing.  She does have a memory disorder, she takes Ditropan and amitriptyline, and she also is on Aricept.  The patient has recently developed pain in the right V3 distribution, she also has more chronic pain issues in the right occipital nerve distribution.  The trigeminal nerve pain began several weeks ago.  Sometimes swallowing will bring on the discomfort.  The patient reports generalized fatigue.  The patient is on gabapentin dosing currently.  Her blood pressure has been elevated on the last several checks.  Past Medical History:  Diagnosis Date  . Abnormality of gait 12/03/2013  . Anxiety   . Arthritis   . Asthma   . Chronic pain syndrome   . COPD (chronic obstructive pulmonary disease) (HCC)   . Degenerative arthritis   . Depression   . Diverticulosis   . Hypertension   . Memory deficits 12/03/2013  . MS (multiple sclerosis) (HCC)   . Neurogenic bladder 02/01/2015  . Pneumonia    over 5 yrs ago  . 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 UTI (urinary tract infection)     Past Surgical History:  Procedure Laterality Date  . ABDOMINAL HYSTERECTOMY    . APPENDECTOMY    . BACK SURGERY      lumbar   x 3 surgeries  . CATARACT EXTRACTION Bilateral   . CHOLECYSTECTOMY    . SHOULDER HEMI-ARTHROPLASTY  05/07/2011   Procedure: SHOULDER HEMI-ARTHROPLASTY;  Surgeon: Raymon Mutton, MD;  Location: [facility] OR;  Service: Orthopedics;  Laterality: Left;  . TONSILLECTOMY    . TUBAL LIGATION Bilateral     Family History  Problem Relation Age of Onset  . Stroke Mother   . Dementia Mother   . Heart attack Father   . Heart failure Sister   . Diabetes Brother   . Heart disease Brother     Social history:  reports that  has never smoked. she has never used smokeless tobacco. She reports that she does not drink alcohol or use drugs.    Allergies  Allergen Reactions  . Aubagio [Teriflunomide]     Increased LFT's  . Penicillins Rash    Medications:  Prior to Admission medications   Medication Sig Start Date End Date Taking? Authorizing Provider  albuterol (PROAIR HFA) 108 (90 BASE) MCG/ACT inhaler Inhale into the lungs. Reported on 06/28/2015   Yes [provider]  amitriptyline (ELAVIL) 50 MG tablet Take 100 mg by mouth at bedtime.   Yes [provider]  amLODipine (NORVASC) 5 MG tablet Take 5 mg by mouth daily.   Yes [provider]  aspirin 81 MG tablet Take 81 mg by mouth daily.  Yes [provider]  bisacodyl (DULCOLAX) 5 MG EC tablet Take 10 mg by mouth daily as needed for moderate constipation.   Yes [provider]  Carbidopa-Levodopa ER (SINEMET CR) 25-100 MG tablet controlled release Take 1 tablet by mouth 2 (two) times daily. 08/06/16  Yes York Spaniel, MD  celecoxib (CELEBREX) 200 MG capsule Take 200 mg by mouth daily.   Yes [provider]  cyanocobalamin 500 MCG tablet Take by mouth.   Yes [provider]  dalfampridine (AMPYRA) 10 MG TB12 Take 1 tablet (10 mg total) by mouth 2 (two) times daily. 07/23/16  Yes York Spaniel, MD  Dimethyl Fumarate (TECFIDERA) 240 MG CPDR Take 1 capsule (240 mg total) by  mouth 2 (two) times daily. 05/10/16  Yes York Spaniel, MD  docusate sodium (COLACE) 100 MG capsule Take 100 mg by mouth 2 (two) times daily. Reported on 06/28/2015   Yes [provider]  donepezil (ARICEPT) 5 MG tablet Take 10 mg by mouth at bedtime.    Yes [provider]  DULoxetine (CYMBALTA) 60 MG capsule Take 60 mg by mouth daily.   Yes [provider]  gabapentin (NEURONTIN) 300 MG capsule Take 600 mg by mouth 2 (two) times daily.    Yes [provider]  methadone (DOLOPHINE) 5 MG tablet TK 1 T PO QID 01/10/15  Yes [provider]  mirabegron ER (MYRBETRIQ) 50 MG TB24 tablet Take 50 mg by mouth daily.   Yes [provider]  tolterodine (DETROL LA) 2 MG 24 hr capsule Take 2 mg by mouth daily. Reported on 06/28/2015    [provider]    ROS:  Out of a complete 14 system review of symptoms, the patient complains only of the following symptoms, and all other reviewed systems are negative.  Decreased appetite, fatigue Gait disorder  Blood pressure (!) 189/90, pulse 84, height 5' (1.524 m), weight 110 lb (49.9 kg).  Physical Exam  General: The patient is alert and cooperative at the time of the examination.  Skin: No significant peripheral edema is noted.   Neurologic Exam  Mental status: The patient is alert and oriented x 3 at the time of the examination. The patient has apparent normal recent and remote memory, with an apparently normal attention span and concentration ability.   Cranial nerves: Facial symmetry is present. Speech is low amplitude, despondent, not a phasic. Extraocular movements are full. Visual fields are full.  Masking of the face is seen.  Pupils are equal, round, and reactive to light.  Discs are flat bilaterally.  Motor: The patient has good strength in all 4 extremities.  Sensory examination: Soft touch sensation is decreased some on the right face, arm, and leg.  Coordination: The patient  has good finger-nose-finger and heel-to-shin bilaterally.  Gait and station: The patient has difficulty arising from a seated position with arms crossed, she has a tendency to lean backwards slightly.  Posture is stooped, the patient has some freezing when initiating ambulation.  The patient has some shuffling with turns.  Romberg is negative.  No drift is seen.  Reflexes: Deep tendon reflexes are symmetric.   Assessment/Plan:  1.  Parkinson's disease  2.  Gait disorder  3.  Multiple sclerosis  4.  Right occipital neuralgia, right V3 distribution trigeminal neuralgia  5.  Memory disorder  The patient has not had any recent hallucinations, she believes that her memory is doing relatively well.  The patient has had ongoing  issues with falls, she is on a very low dose of the Sinemet but she is still having some nausea.  We will convert to Stalevo trying to work up to 25/100 mg tablet 3 times daily.  The patient will have blood work done today.  If the trigeminal neuralgia worsens, we may need to increase the gabapentin dose.  She otherwise will follow-up in 4 months.  Marlan Palau. Keith Willis MD 12/10/2016 11:34 AM  [facility] [address] [place], [place] [id number]  Phone [phone] Fax [phone]

## 2016-12-11 ENCOUNTER — Telehealth: Payer: Self-pay | Admitting: *Deleted

## 2016-12-11 LAB — COMPREHENSIVE METABOLIC PANEL
A/G RATIO: 1.9 (ref 1.2–2.2)
ALK PHOS: 93 IU/L (ref 39–117)
ALT: 33 IU/L — AB (ref 0–32)
AST: 23 IU/L (ref 0–40)
Albumin: 4.6 g/dL (ref 3.5–4.8)
BUN/Creatinine Ratio: 25 (ref 12–28)
BUN: 18 mg/dL (ref 8–27)
Bilirubin Total: 0.6 mg/dL (ref 0.0–1.2)
CHLORIDE: 101 mmol/L (ref 96–106)
CO2: 26 mmol/L (ref 20–29)
Calcium: 9.9 mg/dL (ref 8.7–10.3)
Creatinine, Ser: 0.71 mg/dL (ref 0.57–1.00)
GFR calc Af Amer: 98 mL/min/{1.73_m2} (ref >59)
GFR calc non Af Amer: 85 mL/min/{1.73_m2} (ref >59)
GLUCOSE: 109 mg/dL — AB (ref 65–99)
Globulin, Total: 2.4 g/dL (ref 1.5–4.5)
POTASSIUM: 3.8 mmol/L (ref 3.5–5.2)
Sodium: 143 mmol/L (ref 134–144)
Total Protein: 7 g/dL (ref 6.0–8.5)

## 2016-12-11 LAB — CBC WITH DIFFERENTIAL/PLATELET
Basophils Absolute: 0 10*3/uL (ref 0.0–0.2)
Basos: 1 %
EOS (ABSOLUTE): 0.2 10*3/uL (ref 0.0–0.4)
Eos: 3 %
Hematocrit: 39.8 % (ref 34.0–46.6)
Hemoglobin: 13.5 g/dL (ref 11.1–15.9)
IMMATURE GRANS (ABS): 0 10*3/uL (ref 0.0–0.1)
IMMATURE GRANULOCYTES: 0 %
LYMPHS: 10 %
Lymphocytes Absolute: 0.6 10*3/uL — ABNORMAL LOW (ref 0.7–3.1)
MCH: 28.5 pg (ref 26.6–33.0)
MCHC: 33.9 g/dL (ref 31.5–35.7)
MCV: 84 fL (ref 79–97)
Monocytes Absolute: 0.5 10*3/uL (ref 0.1–0.9)
Monocytes: 8 %
NEUTROS ABS: 4.6 10*3/uL (ref 1.4–7.0)
NEUTROS PCT: 78 %
PLATELETS: 282 10*3/uL (ref 150–379)
RBC: 4.73 x10E6/uL (ref 3.77–5.28)
RDW: 14.4 % (ref 12.3–15.4)
WBC: 5.8 10*3/uL (ref 3.4–10.8)

## 2016-12-11 NOTE — Telephone Encounter (Signed)
Called and spoke with patient. Relayed results per CW,MD note. She verbalized understanding.

## 2016-12-11 NOTE — Telephone Encounter (Signed)
-----   Message from York Spaniel, MD sent at 12/11/2016  7:19 AM EST ----- Blood work is unremarkable with exception of a very minimal elevation of the ALT liver enzyme, not clinically significant.  No change in dosing of medication. Please call the patient. ----- Message ----- From: Nell [facility] Lab Results In Sent: 12/11/2016   5:40 AM To: York Spaniel, MD

## 2017-01-09 ENCOUNTER — Telehealth: Payer: Self-pay | Admitting: *Deleted

## 2017-01-09 NOTE — Telephone Encounter (Signed)
Faxed completed/signed PA form for rx Tecfidera back to [facility]. Fax: [phone]. Received fax confirmation. Waiting for determination from insurance.

## 2017-01-10 NOTE — Telephone Encounter (Signed)
PA approved by [facility] [phone]) through 01/10/2019.  PT OZ#[id number].

## 2017-01-17 NOTE — Telephone Encounter (Signed)
PA Tecfidera approved via [facility] 01/09/17-01/10/2019. VP#[id number]. Received copy of approval letter via fax.

## 2017-02-18 ENCOUNTER — Telehealth: Payer: Self-pay | Admitting: *Deleted

## 2017-02-18 NOTE — Telephone Encounter (Signed)
Received fax from [employer] free [employer] patient assistance program that [facility] is the new pharmacy servicing [employer] [facility] [employer] program. Pt has been accepted into the program.   Faxed signed order back to [employer] for Tecfiera 240mg , take 1 capsule twice daily, qty 60 with 11 refills. Fax: [phone]. Received fax confirmation.

## 2017-04-02 ENCOUNTER — Encounter: Payer: Self-pay | Admitting: *Deleted

## 2017-04-29 ENCOUNTER — Other Ambulatory Visit: Payer: Self-pay | Admitting: Neurology

## 2017-05-29 ENCOUNTER — Other Ambulatory Visit: Payer: Self-pay

## 2017-05-29 ENCOUNTER — Encounter: Payer: Self-pay | Admitting: Neurology

## 2017-05-29 ENCOUNTER — Ambulatory Visit: Payer: Medicare PPO | Admitting: Neurology

## 2017-05-29 VITALS — BP 135/68 | HR 90 | Ht 60.0 in | Wt 103.5 lb

## 2017-05-29 DIAGNOSIS — G35 Multiple sclerosis: Secondary | ICD-10-CM | POA: Diagnosis not present

## 2017-05-29 DIAGNOSIS — R269 Unspecified abnormalities of gait and mobility: Secondary | ICD-10-CM | POA: Diagnosis not present

## 2017-05-29 DIAGNOSIS — G20 Parkinson's disease: Secondary | ICD-10-CM

## 2017-05-29 DIAGNOSIS — R413 Other amnesia: Secondary | ICD-10-CM

## 2017-05-29 MED ORDER — DIMETHYL FUMARATE 240 MG PO CPDR: 1.0000 | DELAYED_RELEASE_CAPSULE | Freq: Two times a day (BID) | ORAL | 11 refills | Status: DC

## 2017-05-29 NOTE — Progress Notes (Signed)
Faxed printed/signed rx Tecifdera to [facility] at [phone]. Received fax confirmation.

## 2017-05-29 NOTE — Patient Instructions (Signed)
   We will get Home health physical therapy

## 2017-05-29 NOTE — Progress Notes (Signed)
Reason for visit: Multiple sclerosis, Parkinson's disease  Casey Young is an 72 y.o. female  History of present illness:  Ms. Casey Young is a 72 year old right-handed white female with a history of Parkinson's disease and multiple sclerosis.  The patient is on Tecfidera, she is tolerating this medication fairly well.  The patient reports no new numbness or weakness of the face, arms, or legs.  She does report some right eye pain over the last 2 months, she will be seeing her ophthalmologist in the near future.  The patient has Parkinson's disease, she has a tendency to lean to the left, she has a resting tremor on the right.  She is having issues with gait instability, she is falling frequently, the last fall was about 5 days ago.  She has a standing walker, she spends much of the mid day by herself.  She is having a lot of nausea when trying to take Stalevo, she does not eat a full meal frequently because there is no one to fix it for her.  If she eats a full meal and then takes her medication, she can tolerate the drug.  The patient is considering moving to an assisted living facility in the near future.  The patient continues on Ampyra.  She claims that her primary care physician has checked blood work within the last day or so, she believes that a liver profile and a CBC were done.  Past Medical History:  Diagnosis Date  . Abnormality of gait 12/03/2013  . Anxiety   . Arthritis   . Asthma   . Chronic pain syndrome   . COPD (chronic obstructive pulmonary disease) (HCC)   . Degenerative arthritis   . Depression   . Diverticulosis   . Hypertension   . Memory deficits 12/03/2013  . MS (multiple sclerosis) (HCC)   . Neurogenic bladder 02/01/2015  . Pneumonia    over 5 yrs ago  . 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 UTI (urinary tract infection)     Past Surgical History:  Procedure Laterality Date  . ABDOMINAL HYSTERECTOMY    . APPENDECTOMY    . BACK SURGERY     lumbar   x 3 surgeries  . CATARACT EXTRACTION Bilateral   . CHOLECYSTECTOMY    . SHOULDER HEMI-ARTHROPLASTY  05/07/2011   Procedure: SHOULDER HEMI-ARTHROPLASTY;  Surgeon: Raymon Mutton, MD;  Location: [facility] OR;  Service: Orthopedics;  Laterality: Left;  . TONSILLECTOMY    . TUBAL LIGATION Bilateral     Family History  Problem Relation Age of Onset  . Stroke Mother   . Dementia Mother   . Heart attack Father   . Heart failure Sister   . Diabetes Brother   . Heart disease Brother     Social history:  reports that she has never smoked. She has never used smokeless tobacco. She reports that she does not drink alcohol or use drugs.    Allergies  Allergen Reactions  . Aubagio [Teriflunomide]     Increased LFT's  . Penicillins Rash    Medications:  Prior to Admission medications   Medication Sig Start Date End Date Taking? Authorizing Provider  amitriptyline (ELAVIL) 50 MG tablet Take 100 mg by mouth at bedtime.   Yes [provider]  amLODipine (NORVASC) 10 MG tablet Take 10 mg by mouth daily.   Yes [provider]  aspirin 81 MG tablet Take 81 mg by mouth daily.   Yes [provider]  buPROPion (WELLBUTRIN XL) 300 MG 24 hr tablet Take 300 mg by mouth daily.   Yes [provider]  Calcium-Magnesium-Vitamin D (CALCIUM 500 PO) Take 500 mg by mouth 2 (two) times daily.   Yes [provider]  carbidopa-levodopa-entacapone (STALEVO 100) 25-100-200 MG tablet Take 1 tablet by mouth 3 (three) times daily. 12/10/16  Yes York Spaniel, MD  celecoxib (CELEBREX) 200 MG capsule Take 200 mg by mouth daily.   Yes [provider]  dalfampridine (AMPYRA) 10 MG TB12 Take 1 tablet (10 mg total) by mouth 2 (two) times daily. 07/23/16  Yes York Spaniel, MD  Dimethyl Fumarate (TECFIDERA) 240 MG CPDR Take 1 capsule (240 mg total) by mouth 2 (two) times daily. 05/29/17  Yes York Spaniel, MD  donepezil (ARICEPT) 10 MG tablet Take 10 mg by mouth  at bedtime.   Yes [provider]  DULoxetine (CYMBALTA) 60 MG capsule Take 60 mg by mouth daily.   Yes [provider]  gabapentin (NEURONTIN) 300 MG capsule Take 600 mg by mouth 2 (two) times daily.    Yes [provider]  methadone (DOLOPHINE) 10 MG tablet Take 10 mg by mouth 2 (two) times daily.   Yes [provider]  mirabegron ER (MYRBETRIQ) 50 MG TB24 tablet Take 50 mg by mouth daily.   Yes [provider]    ROS:  Out of a complete 14 system review of symptoms, the patient complains only of the following symptoms, and all other reviewed systems are negative.  Walking problems, falls Memory disorder  Blood pressure 135/68, pulse 90, height 5' (1.524 m), weight 103 lb 8 oz (46.9 kg), SpO2 96 %.  Physical Exam  General: The patient is alert and cooperative at the time of the examination.  The patient leans to the left with sitting, the patient is in a wheelchair.  Skin: No significant peripheral edema is noted.   Neurologic Exam  Mental status: The patient is alert and oriented x 3 at the time of the examination. The patient has apparent normal recent and remote memory, with an apparently normal attention span and concentration ability.   Cranial nerves: Facial symmetry is present. Speech is normal, no aphasia or dysarthria is noted. Extraocular movements are full. Visual fields are full.  Masking of the face is seen.  Motor: The patient has good strength in all 4 extremities.  Sensory examination: Soft touch sensation is slightly decreased on the right face and arm, decreased on the right leg as compared to the left.  Coordination: The patient has good finger-nose-finger and heel-to-shin bilaterally.  A resting tremors noted with the right upper extremity.  Gait and station: The patient requires some assistance with standing, once up, she is able to ambulate short distances with assistance.  Patient has short shuffling steps,  significant gait instability.  Reflexes: Deep tendon reflexes are symmetric.   Assessment/Plan:  1.  Parkinson's disease  2.  Mild memory disturbance  3.  Multiple sclerosis  4.  Gait disorder, multiple falls  5.  Right V3 distribution trigeminal neuralgia  The patient has done well with her trigeminal neuralgia pain, she is not having much issues in this regard.  She is having difficulty with multiple falls, she is not able to get her full dose of Stalevo and because of nausea, this is in part because she is not eating well and she has a tendency to lose weight.  I have recommended that they get a  countertop level microwave oven so that she can heat up preprepared food and eat much better.  She needs to eat first and then take her medication.  The patient will continue the Tecfidera.  Blood work apparently has been done recently.  She will follow-up in about 5 months.  I will get home health physical therapy out to the house to help her with gait training.  Marlan Palau MD 05/29/2017 12:40 PM  [facility] [address] [place], [place] [address]  Phone [phone] Fax [phone]

## 2017-06-04 ENCOUNTER — Telehealth: Payer: Self-pay | Admitting: Neurology

## 2017-06-04 NOTE — Telephone Encounter (Signed)
I have called patient to see if patient has been called for home physical therapy asked patient to call me back. Thanks Annabelle Harman.

## 2017-06-04 NOTE — Telephone Encounter (Signed)
Spoke to Patient  Referral has been sent to Home Nurse in [place].  Telephone [phone].

## 2017-06-04 NOTE — Telephone Encounter (Signed)
Pt returning Dana's call wouldn't state if PT had called. Requested Annabelle Harman give her a call back.

## 2017-06-17 NOTE — Telephone Encounter (Signed)
Patient called and wanted referral sent to [facility] physical therapy Telephone [phone] - fax [phone]. Referral sent.

## 2017-06-26 ENCOUNTER — Telehealth: Payer: Self-pay | Admitting: *Deleted

## 2017-06-26 NOTE — Telephone Encounter (Signed)
Faxed reviewed/signed PT POC orders back to [facility] at [phone]. Received fax confirmation.

## 2017-07-15 ENCOUNTER — Other Ambulatory Visit: Payer: Self-pay | Admitting: *Deleted

## 2017-07-15 MED ORDER — DALFAMPRIDINE ER 10 MG PO TB12: 10.0000 mg | ORAL_TABLET | Freq: Two times a day (BID) | ORAL | 3 refills | Status: DC

## 2017-12-04 ENCOUNTER — Ambulatory Visit: Payer: Medicare PPO | Admitting: Neurology

## 2017-12-04 ENCOUNTER — Encounter: Payer: Self-pay | Admitting: Neurology

## 2017-12-04 ENCOUNTER — Other Ambulatory Visit: Payer: Self-pay

## 2017-12-04 VITALS — BP 158/78 | HR 86 | Resp 18 | Ht 60.0 in | Wt 105.0 lb

## 2017-12-04 DIAGNOSIS — G35 Multiple sclerosis: Secondary | ICD-10-CM | POA: Diagnosis not present

## 2017-12-04 DIAGNOSIS — G20 Parkinson's disease: Secondary | ICD-10-CM | POA: Diagnosis not present

## 2017-12-04 MED ORDER — DONEPEZIL HCL 5 MG PO TABS: 5.0000 mg | ORAL_TABLET | Freq: Every day | ORAL | 1 refills | Status: AC

## 2017-12-04 MED ORDER — ONDANSETRON HCL 4 MG PO TABS: 4.0000 mg | ORAL_TABLET | Freq: Three times a day (TID) | ORAL | 1 refills | Status: DC | PRN

## 2017-12-04 MED ORDER — CARBIDOPA-LEVODOPA ER 25-100 MG PO TBCR: 1.0000 | EXTENDED_RELEASE_TABLET | Freq: Three times a day (TID) | ORAL | 1 refills | Status: DC

## 2017-12-04 NOTE — Patient Instructions (Signed)
We will change from Stalevo to Sinemet CR 25/100 mg tablets, take one three times a day.  Reduce the Aricept (donepezil) to 5 mg at night.  We will use zofran if needed for the nausea.

## 2017-12-04 NOTE — Progress Notes (Signed)
Reason for visit: Parkinson's disease, multiple sclerosis  Casey Young is an 72 y.o. female  History of present illness:  Casey Young is a 72 year old right-handed white female with a history of multiple sclerosis on Tecfidera and she takes Ampyra.  The patient has Parkinson's disease.  She is on Stalevo taking the 25/100/200 mg tablets.  The patient is supposed to be taking 1 tablet 3 times daily.  In reality, the patient has a poor appetite, she does not feel hungry, she cannot take her medications without eating and therefore she may only take 1 or sometimes 2 of the Stalevo tablets daily.  The patient has been losing some weight, she takes Aricept 10 mg at night for a memory disorder.  The patient has fallen on occasion, the last fall was 3 or 4 days ago, the patient has a tendency to fall backwards.  The last fall occurred when she was not using a walker.  The patient lives with her daughter at home, she does have a pill dispenser and she does have people who fix her meals.  The patient has not had any new numbness or weakness of the face, arms, legs.  She comes in today indicating that she did not take her medication for Parkinson's disease this morning, the last Stalevo tablet was yesterday sometime.  Past Medical History:  Diagnosis Date  . Abnormality of gait 12/03/2013  . Anxiety   . Arthritis   . Asthma   . Chronic pain syndrome   . COPD (chronic obstructive pulmonary disease) (HCC)   . Degenerative arthritis   . Depression   . Diverticulosis   . Hypertension   . Memory deficits 12/03/2013  . MS (multiple sclerosis) (HCC)   . Neurogenic bladder 02/01/2015  . Pneumonia    over 5 yrs ago  . 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 UTI (urinary tract infection)     Past Surgical History:  Procedure Laterality Date  . ABDOMINAL HYSTERECTOMY    . APPENDECTOMY    . BACK SURGERY     lumbar   x 3 surgeries  . CATARACT EXTRACTION Bilateral   . CHOLECYSTECTOMY      . SHOULDER HEMI-ARTHROPLASTY  05/07/2011   Procedure: SHOULDER HEMI-ARTHROPLASTY;  Surgeon: Raymon Mutton, MD;  Location: [facility] OR;  Service: Orthopedics;  Laterality: Left;  . TONSILLECTOMY    . TUBAL LIGATION Bilateral     Family History  Problem Relation Age of Onset  . Stroke Mother   . Dementia Mother   . Heart attack Father   . Heart failure Sister   . Diabetes Brother   . Heart disease Brother     Social history:  reports that she has never smoked. She has never used smokeless tobacco. She reports that she does not drink alcohol or use drugs.    Allergies  Allergen Reactions  . Aubagio [Teriflunomide]     Increased LFT's  . Penicillins Rash    Medications:  Prior to Admission medications   Medication Sig Start Date End Date Taking? Authorizing Provider  amitriptyline (ELAVIL) 50 MG tablet Take 100 mg by mouth at bedtime.   Yes [provider]  amLODipine (NORVASC) 10 MG tablet Take 10 mg by mouth daily.   Yes [provider]  aspirin 81 MG tablet Take 81 mg by mouth daily.   Yes [provider]  buPROPion (WELLBUTRIN XL) 300 MG 24 hr tablet Take 300 mg by mouth daily.  Yes [provider]  Calcium-Magnesium-Vitamin D (CALCIUM 500 PO) Take 500 mg by mouth 2 (two) times daily.   Yes [provider]  carbidopa-levodopa-entacapone (STALEVO 100) 25-100-200 MG tablet Take 1 tablet by mouth 3 (three) times daily. 12/10/16  Yes York Spaniel, MD  celecoxib (CELEBREX) 200 MG capsule Take 200 mg by mouth daily.   Yes [provider]  dalfampridine (AMPYRA) 10 MG TB12 Take 1 tablet (10 mg total) by mouth 2 (two) times daily. 07/15/17  Yes York Spaniel, MD  Dimethyl Fumarate (TECFIDERA) 240 MG CPDR Take 1 capsule (240 mg total) by mouth 2 (two) times daily. 05/29/17  Yes York Spaniel, MD  donepezil (ARICEPT) 10 MG tablet Take 10 mg by mouth at bedtime.   Yes [provider]  DULoxetine (CYMBALTA) 60 MG  capsule Take 60 mg by mouth daily.   Yes [provider]  gabapentin (NEURONTIN) 300 MG capsule Take 600 mg by mouth 2 (two) times daily.    Yes [provider]  methadone (DOLOPHINE) 10 MG tablet Take 10 mg by mouth 2 (two) times daily.   Yes [provider]  mirabegron ER (MYRBETRIQ) 50 MG TB24 tablet Take 50 mg by mouth daily.   Yes [provider]    ROS:  Out of a complete 14 system review of symptoms, the patient complains only of the following symptoms, and all other reviewed systems are negative.  Constipation Incontinence of the bladder Headache, weakness Depression  Blood pressure (!) 158/78, pulse 86, resp. rate 18, height 5' (1.524 m), weight 105 lb (47.6 kg).  Physical Exam  General: The patient is alert and cooperative at the time of the examination.  Skin: No significant peripheral edema is noted.   Neurologic Exam  Mental status: The patient is alert and oriented x 3 at the time of the examination. The Mini-Mental status examination done today shows a total score 26/30.  The patient is able to name 6 four-legged animals in 1 minute.   Cranial nerves: Facial symmetry is present. Speech is normal, no aphasia or dysarthria is noted. Extraocular movements are full. Visual fields are full.  Pupils are equal, round, and reactive to light.  Discs are flat bilaterally.  Masking of the face is seen.  Motor: The patient has good strength in all 4 extremities.  Sensory examination: Soft touch sensation is symmetric on the face, arms, and legs, with exception of paresthesias with light touch on the right leg.  Coordination: The patient has good finger-nose-finger and heel-to-shin bilaterally.  Gait and station: The patient requires assistance with standing, she has a tendency to lean backwards.  She takes short shuffling steps, she has difficulty with turns.  She cannot stand without assistance.  Reflexes: Deep tendon reflexes are  symmetric.   Assessment/Plan:  1.  Parkinson's disease  2.  Multiple sclerosis  3.  Gait disturbance  4.  Memory disturbance  5.  Decreased appetite, weight loss  The patient will be reduced on the Aricept dose taking 5 mg at night.  She will be switched from Stalevo to Sinemet CR taking the 25/100 mg tablets 3 times daily.  The patient will be given Zofran if needed for nausea.  If she is not doing better in this regard she will contact our office.  She will follow-up in about 5 months.  We will do blood work on the next evaluation.  Marlan Palau MD 12/04/2017 12:43 PM  [facility] [address]  [place], [place] [address]  Phone [phone] Fax [phone]

## 2018-01-16 ENCOUNTER — Telehealth: Payer: Self-pay | Admitting: Neurology

## 2018-01-16 NOTE — Telephone Encounter (Signed)
I contacted the patient and advised we had a opening for 01/20/2017 at 12 pm check in time of 11:30 am and pt was agreeable to this appt. MB RN

## 2018-01-16 NOTE — Telephone Encounter (Signed)
Pt son-in-law Tim/[facility] called requested the pt to be seen sooner due to falling more the past couple of weeks. She has taken any new medication. Please call to advise.

## 2018-01-20 ENCOUNTER — Encounter: Payer: Self-pay | Admitting: Neurology

## 2018-01-20 ENCOUNTER — Ambulatory Visit (INDEPENDENT_AMBULATORY_CARE_PROVIDER_SITE_OTHER): Payer: Medicare HMO | Admitting: Neurology

## 2018-01-20 VITALS — BP 140/84 | HR 61 | Ht 60.0 in | Wt 104.0 lb

## 2018-01-20 DIAGNOSIS — R269 Unspecified abnormalities of gait and mobility: Secondary | ICD-10-CM | POA: Diagnosis not present

## 2018-01-20 DIAGNOSIS — G20 Parkinson's disease: Secondary | ICD-10-CM

## 2018-01-20 DIAGNOSIS — G35 Multiple sclerosis: Secondary | ICD-10-CM

## 2018-01-20 DIAGNOSIS — R5382 Chronic fatigue, unspecified: Secondary | ICD-10-CM

## 2018-01-20 DIAGNOSIS — E538 Deficiency of other specified B group vitamins: Secondary | ICD-10-CM

## 2018-01-20 MED ORDER — ALPRAZOLAM 0.5 MG PO TABS: ORAL_TABLET | ORAL | 0 refills | Status: DC

## 2018-01-20 MED ORDER — QUETIAPINE FUMARATE 50 MG PO TABS: 50.0000 mg | ORAL_TABLET | Freq: Every day | ORAL | 2 refills | Status: DC

## 2018-01-20 MED ORDER — CARBIDOPA-LEVODOPA ER 25-100 MG PO TBCR: 1.0000 | EXTENDED_RELEASE_TABLET | Freq: Four times a day (QID) | ORAL | 1 refills | Status: AC

## 2018-01-20 NOTE — Patient Instructions (Signed)
We will go up on the Sinemet (carbidopa) tablet to 4 times a day.  Stop the amitriptyline 100 mg at night.  Start Seroquel 50 mg at night.

## 2018-01-20 NOTE — Progress Notes (Signed)
Reason for visit: Multiple sclerosis, Parkinson's disease, memory disturbance  Casey Young is an 73 y.o. female  History of present illness:  Casey Young is a 73 year old right-handed white female with a history of multiple sclerosis diagnosed in the late 1990s.  The patient has been on Tecfidera, she has tolerated the medication fairly well.  She has developed symptoms of Parkinson's disease and she has developed a significant gait disorder associated with this.  The patient was changed from Stalevo to Sinemet CR in November 2019, the patient has had a further decline in her ability to ambulate, she has a stand-up walker but is still falling.  She has fallen recently on 02 January 2018, she fell forward and struck the left lower face.  The patient has had some vertigo since that time.  She never underwent a CT scan or MRI of the brain since the fall, they did x-rays, according to the patient they did not x-ray her cervical spine.  The patient is now having hallucinations, she will see people about the house and at church.  The patient is on high-dose amitriptyline taking 100 mg at night, she takes gabapentin presumably for her trigeminal neuralgia taking 600 mg twice per day.  The patient is also on Wellbutrin and Cymbalta as an antidepressant medication.  She remains on methadone.  She returns to this office for an evaluation.  The patient is having 1 or 2 twinges a week of her trigeminal neuralgia.  Past Medical History:  Diagnosis Date  . Abnormality of gait 12/03/2013  . Anxiety   . Arthritis   . Asthma   . Chronic pain syndrome   . COPD (chronic obstructive pulmonary disease) (HCC)   . Degenerative arthritis   . Depression   . Diverticulosis   . Hypertension   . Memory deficits 12/03/2013  . MS (multiple sclerosis) (HCC)   . Neurogenic bladder 02/01/2015  . Pneumonia    over 5 yrs ago  . 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 UTI (urinary tract infection)      Past Surgical History:  Procedure Laterality Date  . ABDOMINAL HYSTERECTOMY    . APPENDECTOMY    . BACK SURGERY     lumbar   x 3 surgeries  . CATARACT EXTRACTION Bilateral   . CHOLECYSTECTOMY    . SHOULDER HEMI-ARTHROPLASTY  05/07/2011   Procedure: SHOULDER HEMI-ARTHROPLASTY;  Surgeon: Raymon Mutton, MD;  Location: [facility] OR;  Service: Orthopedics;  Laterality: Left;  . TONSILLECTOMY    . TUBAL LIGATION Bilateral     Family History  Problem Relation Age of Onset  . Stroke Mother   . Dementia Mother   . Heart attack Father   . Heart failure Sister   . Diabetes Brother   . Heart disease Brother     Social history:  reports that she has never smoked. She has never used smokeless tobacco. She reports that she does not drink alcohol or use drugs.    Allergies  Allergen Reactions  . Aubagio [Teriflunomide]     Increased LFT's  . Penicillins Rash    Medications:  Prior to Admission medications   Medication Sig Start Date End Date Taking? Authorizing Provider  amitriptyline (ELAVIL) 50 MG tablet Take 100 mg by mouth at bedtime.   Yes [provider]  amLODipine (NORVASC) 10 MG tablet Take 10 mg by mouth daily.   Yes [provider]  aspirin 81 MG tablet Take 81 mg by  mouth daily.   Yes [provider]  buPROPion (WELLBUTRIN XL) 300 MG 24 hr tablet Take 300 mg by mouth daily.   Yes [provider]  Calcium-Magnesium-Vitamin D (CALCIUM 500 PO) Take 500 mg by mouth 2 (two) times daily.   Yes [provider]  Carbidopa-Levodopa ER (SINEMET CR) 25-100 MG tablet controlled release Take 1 tablet by mouth 3 (three) times daily. 12/04/17  Yes York Spaniel, MD  celecoxib (CELEBREX) 200 MG capsule Take 200 mg by mouth daily.   Yes [provider]  dalfampridine (AMPYRA) 10 MG TB12 Take 1 tablet (10 mg total) by mouth 2 (two) times daily. 07/15/17  Yes York Spaniel, MD  Dimethyl Fumarate (TECFIDERA) 240 MG CPDR Take 1 capsule  (240 mg total) by mouth 2 (two) times daily. 05/29/17  Yes York Spaniel, MD  donepezil (ARICEPT) 5 MG tablet Take 1 tablet (5 mg total) by mouth at bedtime. 12/04/17  Yes York Spaniel, MD  DULoxetine (CYMBALTA) 60 MG capsule Take 60 mg by mouth daily.   Yes [provider]  fesoterodine (TOVIAZ) 4 MG TB24 tablet Take 4 mg by mouth daily.   Yes [provider]  gabapentin (NEURONTIN) 300 MG capsule Take 600 mg by mouth 2 (two) times daily.    Yes [provider]  methadone (DOLOPHINE) 10 MG tablet Take 10 mg by mouth 2 (two) times daily.   Yes [provider]  mirabegron ER (MYRBETRIQ) 50 MG TB24 tablet Take 50 mg by mouth daily.   Yes [provider]  ondansetron (ZOFRAN) 4 MG tablet Take 1 tablet (4 mg total) by mouth every 8 (eight) hours as needed for nausea or vomiting. 12/04/17  Yes York Spaniel, MD    ROS:  Out of a complete 14 system review of symptoms, the patient complains only of the following symptoms, and all other reviewed systems are negative.  Eye pain Restless legs Difficulty urinating, painful urination Dizziness, headache Confusion, depression, anxiety, hallucinations  Blood pressure 140/84, pulse 61, height 5' (1.524 m), weight 104 lb (47.2 kg), SpO2 100 %.  Physical Exam  General: The patient is alert and cooperative at the time of the examination.  Skin: No significant peripheral edema is noted.   Neurologic Exam  Mental status: The patient is alert and oriented x 3 at the time of the examination. The patient has apparent normal recent and remote memory, with an apparently normal attention span and concentration ability.   Cranial nerves: Facial symmetry is present. Speech is normal, no aphasia or dysarthria is noted. Extraocular movements are full. Visual fields are full.  Masking of the face is seen.  Motor: The patient has good strength in all 4 extremities.  Sensory examination: Soft touch  sensation is symmetric on the face, arms, and legs.  Coordination: The patient has good finger-nose-finger and heel-to-shin bilaterally.  Gait and station: The patient requires some assistance with standing.  Once up, the patient has frequent freezing episodes, she has difficulty moving the legs.  She requires assistance for walking.  Reflexes: Deep tendon reflexes are symmetric.   Assessment/Plan:  1.  Parkinson's disease  2.  Memory disturbance  3.  Gait disturbance  4.  Trigeminal neuralgia  5.  Multiple sclerosis  The patient currently is having increased difficulty walking mainly because of increased freezing associated with her Parkinson's disease.  The patient is on Tecfidera for the multiple sclerosis.  Blood work will be done today, MRI of the  brain will be done, particularly after a recent fall with a bump to the head.  The patient will be increased on the Sinemet taking the CR tablet 25/100 mg tablet 4 times daily.  The patient will be taken off of the amitriptyline and switched to Seroquel 50 mg at night for sleep and for the hallucinations, she will call for any dose adjustments of this medication.  She will follow-up for her next scheduled visit, the patient may require physical therapy in the home environment.  The patient has questioned whether or not she may require an assisted living facility, this may be needed in the future if she is spending long periods of time at home alone.  The patient is high risk for falls and injury.  Marlan Palau MD 01/20/2018 12:28 PM  [facility] [address] [place], [place] [address]  Phone [phone] Fax [phone]

## 2018-01-21 ENCOUNTER — Telehealth: Payer: Self-pay | Admitting: Neurology

## 2018-01-21 ENCOUNTER — Encounter: Payer: Self-pay | Admitting: *Deleted

## 2018-01-21 LAB — CBC WITH DIFFERENTIAL/PLATELET
BASOS ABS: 0 10*3/uL (ref 0.0–0.2)
Basos: 1 %
EOS (ABSOLUTE): 0.1 10*3/uL (ref 0.0–0.4)
Eos: 2 %
HEMOGLOBIN: 13.5 g/dL (ref 11.1–15.9)
Hematocrit: 39.8 % (ref 34.0–46.6)
IMMATURE GRANS (ABS): 0 10*3/uL (ref 0.0–0.1)
IMMATURE GRANULOCYTES: 0 %
LYMPHS ABS: 0.3 10*3/uL — AB (ref 0.7–3.1)
LYMPHS: 10 %
MCH: 28.8 pg (ref 26.6–33.0)
MCHC: 33.9 g/dL (ref 31.5–35.7)
MCV: 85 fL (ref 79–97)
MONOCYTES: 9 %
Monocytes Absolute: 0.3 10*3/uL (ref 0.1–0.9)
Neutrophils Absolute: 2.5 10*3/uL (ref 1.4–7.0)
Neutrophils: 78 %
Platelets: 259 10*3/uL (ref 150–450)
RBC: 4.68 x10E6/uL (ref 3.77–5.28)
RDW: 12.9 % (ref 11.7–15.4)
WBC: 3.2 10*3/uL — AB (ref 3.4–10.8)

## 2018-01-21 LAB — COMPREHENSIVE METABOLIC PANEL
A/G RATIO: 1.8 (ref 1.2–2.2)
ALBUMIN: 4.4 g/dL (ref 3.5–4.8)
ALT: 34 IU/L — ABNORMAL HIGH (ref 0–32)
AST: 27 IU/L (ref 0–40)
Alkaline Phosphatase: 97 IU/L (ref 39–117)
BILIRUBIN TOTAL: 0.4 mg/dL (ref 0.0–1.2)
BUN / CREAT RATIO: 25 (ref 12–28)
BUN: 19 mg/dL (ref 8–27)
CHLORIDE: 104 mmol/L (ref 96–106)
CO2: 23 mmol/L (ref 20–29)
Calcium: 9 mg/dL (ref 8.7–10.3)
Creatinine, Ser: 0.77 mg/dL (ref 0.57–1.00)
GFR calc Af Amer: 89 mL/min/{1.73_m2} (ref >59)
GFR calc non Af Amer: 77 mL/min/{1.73_m2} (ref >59)
GLUCOSE: 97 mg/dL (ref 65–99)
Globulin, Total: 2.4 g/dL (ref 1.5–4.5)
Potassium: 4.1 mmol/L (ref 3.5–5.2)
Sodium: 142 mmol/L (ref 134–144)
Total Protein: 6.8 g/dL (ref 6.0–8.5)

## 2018-01-21 LAB — TSH: TSH: 1.73 u[IU]/mL (ref 0.450–4.500)

## 2018-01-21 LAB — VITAMIN B12: Vitamin B-12: 444 pg/mL (ref 232–1245)

## 2018-01-21 MED ORDER — DIMETHYL FUMARATE 120 MG PO CPDR: 120.0000 mg | DELAYED_RELEASE_CAPSULE | Freq: Two times a day (BID) | ORAL | 1 refills | Status: DC

## 2018-01-21 NOTE — Telephone Encounter (Signed)
Noted. Will discuss with [employer] regarding med change. MB RN

## 2018-01-21 NOTE — Telephone Encounter (Signed)
I called the patient.  Blood work showed a low white count of 3.2, absolute lymphocyte count is 0.3.  The patient is on Tecfidera, these findings would put her at high risk for PML infection.  I have asked the patient to drop her dose to 240 mg daily, I will write a prescription for the 120 mg tablets to see if we can convert to 120 mg twice daily.  I will recheck blood work in 6 weeks, if the white blood count and absolute lymphocyte count do not recover, the patient will need to stop the drug.

## 2018-01-21 NOTE — Telephone Encounter (Signed)
[employer] pending  

## 2018-01-22 NOTE — Telephone Encounter (Signed)
Spoke to [employer] at [facility] I will fax the order to her and once they receive it she will reach out to the pt to schedule.

## 2018-01-22 NOTE — Telephone Encounter (Signed)
[name]: [phone] (exp. 01/21/18 to 02/20/18) Spoke to the patient she would like to have her MRI at [facility] in [place].   I lvm for [employer] to give me a call back to schedule mri.

## 2018-01-23 NOTE — Telephone Encounter (Signed)
I faxed order.

## 2018-01-31 ENCOUNTER — Other Ambulatory Visit: Payer: Self-pay | Admitting: Neurology

## 2018-02-04 ENCOUNTER — Telehealth: Payer: Self-pay | Admitting: Neurology

## 2018-02-04 NOTE — Telephone Encounter (Signed)
Pt is asking for a call from RN to see if Dr Anne Hahn will put her on something to replace her Amitriptyline

## 2018-02-04 NOTE — Telephone Encounter (Signed)
I contacted the pt. I advised per Dr. Anne Hahn last o/v note on 01/20/18 he would be d/c the Amitriptyline and start Seroquel 50 mg 1 tablet at bed time. Pt stated she was not sure which pharmacy this medication had been sent to. I advise Dr. Anne Hahn submitted to [facility] in [place], [place] and pt verbalized understanding.  Pt stated she would pick the medication up and notify our office if she has any issues/compliants once she begins.

## 2018-02-05 ENCOUNTER — Telehealth: Payer: Self-pay

## 2018-02-05 NOTE — Telephone Encounter (Signed)
PA submitted for Quetiapine 50 mg tab 1 daily at bed time via cover my med.  Key [id number] PA ref # [id number]

## 2018-02-06 NOTE — Telephone Encounter (Signed)
PA for Quetiapine 50 mg has been denied. I contacted the pt and left a vm requesting a call back to discuss. I have checked good [facility] and the pt can purchase the medication at [facility] for a retail price of 9.00$. Will discuss with pt if this would be affordable for her.

## 2018-02-07 NOTE — Telephone Encounter (Signed)
I also called pt to discuss. No answer, left a message asking her to call me back.

## 2018-02-10 MED ORDER — QUETIAPINE FUMARATE 50 MG PO TABS: 50.0000 mg | ORAL_TABLET | Freq: Every day | ORAL | 2 refills | Status: AC

## 2018-02-10 NOTE — Telephone Encounter (Addendum)
I contacted the pt. Pt was advised Seroquel was denied through insurance, but [facility] has a retail price of 9.00 $ (price w/o insurance per good rx) I advised we could send to [facility] and pt was agreeable. Rx submitted to [facility] in [place], [place]

## 2018-02-10 NOTE — Addendum Note (Signed)
Addended by: Ann Maki T on: 02/10/2018 02:35 PM   Modules accepted: Orders

## 2018-02-10 NOTE — Telephone Encounter (Signed)
I contacted [facility] ([employer] program) to verify if they had received the rx for Tecfidera 120 mg bid on 01/21/18 fax by Lupe Carney, RN. Fax was received, but Brand (rep with [employer]) advised me the pt was not apart of the program any longer. Last date pt had been active in program was in 2018. We were not notified of this information via fax, mail or phone call. I contacted the pt to discuss this/verifiy where she has been receiving her Tecfidera and was advised she has been receiving the medication through [facility].  Up dated rx faxed to [facility], waiting on response.

## 2018-02-10 NOTE — Telephone Encounter (Signed)
Pt has returned the call to RN Kristen, she is asking for a call back °

## 2018-02-12 NOTE — Telephone Encounter (Signed)
I contacted [employer] and was able to discuss with Bjorn Loser the Tecfidera decrease rx 120 mg bid.Bjorn Loser stated they have received rx and will submit to insurance verification team and then will be able to contact pt for med delivery.

## 2018-02-17 NOTE — Telephone Encounter (Signed)
I contacted the pt. Pt wanted to advise me she has re-enrolled in the [employer]) and is requesting Tecfidera 120 mg bid to be sent to [facility] f # [phone].   While speaking with the pt she also advised me she had received a call from [facility] specialty pharm stating her Dalfampridine 10 mg had been d/c.I advised the pt Dr. Anne Hahn had not recommended this med to be d/c and I would call and advise [employer] of such.  I contacted [facility] and spoke with pharm tech [place]. I advised Dr. Stann Mainland has not authorized the d/c of Dalfampridine 10 mg. Also advised pt would be receiving Tecfidera 120 mg bid through [facility]. Lurena Joiner verbalized understanding on message and stated she would document changes in pt's account. She also advised a representative should be reaching out to Ms. Heinzel about scheduling a shipment date for the Dilfampridine 10 mg.  I have faxed Rx for Tecfidera 120 mg bid to [facility] and received confirmation. I contacted the pt back and advised via vm (ok per dpr) I have spoken with [facility] pharm and advised on continuing Dalfampridine and rx for Tecfidera has been sent to [facility].  Pt advised to call back if she had any further questions/concerns.

## 2018-02-17 NOTE — Telephone Encounter (Signed)
Pt is asking for a call from RN Aundra Millet to discuss a concern re: the tecfidera, please call

## 2018-02-28 ENCOUNTER — Telehealth: Payer: Self-pay | Admitting: Neurology

## 2018-02-28 NOTE — Telephone Encounter (Signed)
I called the patient.  The patient has been noted to have a low white blood count and low absolute lymphocyte count on Tecfidera, the dose was reduced to 120 mg twice daily.  The patient will need to have the blood work rechecked, if the white blood count and absolute lymphocyte count remain low, she will need to stop the medication.  She wishes to have the blood work done through her primary care physician, she will call him to see if he is willing to do this.  The patient will need a CBC with differential.

## 2018-03-05 ENCOUNTER — Telehealth: Payer: Self-pay

## 2018-03-05 ENCOUNTER — Encounter: Payer: Self-pay | Admitting: Neurology

## 2018-03-05 NOTE — Telephone Encounter (Signed)
Received fax from [facility] pharmacy stating Tecfidera 120 mg bid has been approved. Effective 03/05/18-01/15/19

## 2018-03-05 NOTE — Telephone Encounter (Signed)
PA for Tecfidera 120 mg 1 tab bid submitted to [employer] via fax. Fax # [phone], confirmation received.

## 2018-03-10 ENCOUNTER — Telehealth: Payer: Self-pay | Admitting: Neurology

## 2018-03-10 NOTE — Telephone Encounter (Signed)
I called the patient.  The blood work done recently shows a white blood count of 3.3, after lymphocyte of 0.4, still low.  Liver profile is unremarkable.  Apparently the 120 mg Tecfidera tablet was just approved, if the patient has been on the 240 mg tablets prior to this blood work, we will need to wait another month before checking blood work, if she has been on the 120 mg tablet, she is to contact our office, we may have to discontinue the medication completely.

## 2018-03-12 NOTE — Telephone Encounter (Signed)
Pt has returned the call to Dr Anne Hahn, she is asking for a call from RN with an explanation of the message from Dr Priscella Mann

## 2018-03-12 NOTE — Telephone Encounter (Signed)
I contacted the patient. She states she has had the Tecfidera 120 mg for over 1 week but due to the price she wanted to finished up the 240 mg tablets. Patient states she will start the 120 mg on Monday (03/17/18). I advised per Dr. Anne Hahn once she has been on the 120 mg 1 tab bid for one month we will recheck blood tests. Pt was advised recheck of blood tests would be around 04/14/18. Pt verbalized understanding and states she would like to have this recheck completed at her PCP Lorelei Pont).  I advised I would fwd to Dr. Anne Hahn.

## 2018-03-12 NOTE — Telephone Encounter (Signed)
I will put a computer reminder to call the patient in about 4 weeks.

## 2018-04-09 ENCOUNTER — Telehealth: Payer: Self-pay | Admitting: Neurology

## 2018-04-09 NOTE — Telephone Encounter (Signed)
This patient has presented to her primary care physician with onset within the last hour or so of double vision, her examination reveals significant impairment of peripheral vision bilaterally, the patient has some right-sided weakness.  Given the fact that the patient does have MS but she is also 73 years old, I will send her to the emergency room for an urgent evaluation for possible stroke.  The primary care physician will initiate this.

## 2018-04-14 ENCOUNTER — Telehealth: Payer: Self-pay | Admitting: Neurology

## 2018-04-14 NOTE — Telephone Encounter (Signed)
MRI of the brain did not show a stroke event, the double vision likely was related to MS.  We are waiting on a CBC as her white blood count was low previously.  MRI brain April 10, 2018:  Impression:  1.  Scattered T2 and flair hyperintensities periventricular and subcortical white matter which are nonspecific and without mass-effect and may represent demyelination, chronic small vessel ischemic changes, or gliosis.  2.  No acute infarcts, intracranial hemorrhage, or abnormal enhancing masses or lesions.

## 2018-04-14 NOTE — Telephone Encounter (Signed)
Debra- can you help with this? Thank you! 

## 2018-04-14 NOTE — Telephone Encounter (Signed)
I called, waiting on the reports to be faxed over.

## 2018-04-14 NOTE — Telephone Encounter (Signed)
I called the patient, the patient is now on Tecfidera 120 mg twice daily, unfortunately she has been hospitalized for diverticulitis and required partial resection of the colon because of this.  The patient was also evaluated for possible stroke after she went into atrial fibrillation, she had double vision and facial droop.  MRI of the brain was done at [facility] in [place], [place].  The patient is now and [facility] rehab center in [place], [place].  The patient has gotten blood work done through the rehab center, we will try to contact the Center and get the blood work, [facility] [place], get report of MRI of the brain.  Previously on Tecfidera, the white blood count was low and the absolute lymphocyte count was low, the dose was reduced to 120 mg twice daily.

## 2018-04-14 NOTE — Telephone Encounter (Signed)
MRI report has been received. Waiting on CBC, thank you.

## 2018-04-15 ENCOUNTER — Telehealth: Payer: Self-pay | Admitting: Neurology

## 2018-04-15 NOTE — Telephone Encounter (Signed)
CBC result received and given to provider to review.

## 2018-04-15 NOTE — Telephone Encounter (Signed)
Order for CBC with diff faxed to [facility] F # [phone]. Confirmation received.

## 2018-04-15 NOTE — Telephone Encounter (Signed)
I called the patient.  I did receive the blood work done at the [facility] and rehab center.  They did a CBC without differential, therefore I do not have an absolute lymphocyte count, we will need a CBC with differential.  The white blood count is still low at 3.5, hemoglobin is 9.8, hematocrit 31.2, MCV of 89.7, platelets of 287.  Sodium is 143, potassium 3.7, chloride 101, CO2 29, BUN of 14, creatinine 0.55, glucose of 82.  Total protein is 5.5, albumin of 3.0 which is slightly low, calcium of 9.2, liver profile is otherwise normal.  Vitamin B12 level is 447, folate of 8.75, TSH is pending.  I will fax a request for a CBC with differential to be done to the [facility] and rehab center, the fax number that I have is [phone].

## 2018-05-05 ENCOUNTER — Ambulatory Visit: Payer: Medicare PPO | Admitting: Neurology

## 2018-08-04 ENCOUNTER — Telehealth: Payer: Self-pay | Admitting: Neurology

## 2018-08-04 NOTE — Telephone Encounter (Signed)
I reached out to the pt. She states her insurance changed a month ago to [place]. She also has moved to [facility] in [place].  Pt stated she out of her dalframpidine and needs a PA with her insurance and new specialty pharmacy. I called the number for insurance provided and attempted to complete a verbal PA. After waiting for 15 + mins I disconnected the call and completed a fax PA form for MD to sign.  Form placed in Dr. Jannifer Franklin office to complete and then I will fax.  Pt's member ID [id number].

## 2018-08-04 NOTE — Telephone Encounter (Signed)
Pt states that she is now in a nursing home and she is needing her donepezil (ARICEPT) 5 MG tablet and her daughter changed her insurance company to: [employer] Member ID: [id number] Rx Vin# [id number] Group# [id number] Pharmcy# [phone]

## 2018-08-05 MED ORDER — DALFAMPRIDINE ER 10 MG PO TB12: 10.0000 mg | ORAL_TABLET | Freq: Two times a day (BID) | ORAL | 3 refills | Status: DC

## 2018-08-05 NOTE — Telephone Encounter (Signed)
[facility] and electronic rx has been sent to [facility] as well. Waiting on response on both.

## 2018-08-05 NOTE — Telephone Encounter (Signed)
Received a fax back from [facility] stating they do not process PA claims for this patient's insurance.  PA submitted through [place]. Waiting to hear back.

## 2018-08-05 NOTE — Telephone Encounter (Signed)
Received fax from [facility] needing to clarify if the pt's ambulation has improved while taking the dalfampridine~I documented the the improvement and faxed back.

## 2018-08-05 NOTE — Addendum Note (Signed)
Addended by: Verlin Grills T on: 08/05/2018 09:21 AM   Modules accepted: Orders

## 2018-08-06 NOTE — Telephone Encounter (Signed)
PA for Dalfampridine has been approved through [place] from 01/15/2018-13/31/2020.  I attempted to reach the pt to advise this and left a message. When pt calls back, please let her know this and ask the pt to call insurance to verify what her new specialty pharmacy is so we can send rx.

## 2018-08-08 ENCOUNTER — Telehealth: Payer: Self-pay | Admitting: Neurology

## 2018-08-08 NOTE — Telephone Encounter (Signed)
Pt has called for a refill on her dalfampridine (AMPYRA) 10 MG TB12  [facility]

## 2018-08-11 MED ORDER — DALFAMPRIDINE ER 10 MG PO TB12: 10.0000 mg | ORAL_TABLET | Freq: Two times a day (BID) | ORAL | 3 refills | Status: AC

## 2018-08-11 NOTE — Telephone Encounter (Signed)
Rx has been submitted.

## 2018-08-11 NOTE — Telephone Encounter (Signed)
Pt returned call on 08/08/2018 and advised [facility]. Rx has been submitted.

## 2018-08-11 NOTE — Addendum Note (Signed)
Addended by: Verlin Grills T on: 08/11/2018 08:00 AM   Modules accepted: Orders

## 2018-08-21 ENCOUNTER — Telehealth: Payer: Self-pay | Admitting: Neurology

## 2018-08-21 NOTE — Telephone Encounter (Addendum)
[facility], spoke with Jinny Blossom who asked if patient is on tecfidera 120 mg for maintenance dose since 240 mg twice daily is typical. I advised her of Dr Jannifer Franklin' note in Jan stating due to lab results he was reducing her dose. She stated they would ship Tecfidera 120 mg caps,  verbalized understanding, appreciation.

## 2018-08-21 NOTE — Telephone Encounter (Signed)
Peter from pharmacy is asking for a call re: the strength of Dimethyl Fumarate (TECFIDERA) 120 MG CPDR

## 2018-08-25 ENCOUNTER — Telehealth: Payer: Self-pay

## 2018-08-25 NOTE — Telephone Encounter (Signed)
[facility] stating pt's Dalfampridine ER was shipped on 08/19/2018.

## 2018-09-17 ENCOUNTER — Telehealth: Payer: Self-pay

## 2018-09-17 NOTE — Telephone Encounter (Signed)
[facility] stating Dalfampridine 10 mg was shipped to the pt on 09/10/2018.

## 2018-10-14 ENCOUNTER — Telehealth: Payer: Self-pay

## 2018-10-14 NOTE — Telephone Encounter (Signed)
[facility] stating Dalfampridine ER was shipped to the pt on 10/10/2018.

## 2019-02-16 ENCOUNTER — Telehealth: Payer: Self-pay

## 2019-02-16 ENCOUNTER — Other Ambulatory Visit: Payer: Self-pay

## 2019-02-16 ENCOUNTER — Telehealth: Payer: Self-pay | Admitting: Neurology

## 2019-02-16 MED ORDER — DIMETHYL FUMARATE 120 MG PO CPDR: 120.0000 mg | DELAYED_RELEASE_CAPSULE | Freq: Two times a day (BID) | ORAL | 3 refills | Status: DC

## 2019-02-16 NOTE — Telephone Encounter (Deleted)
Made in error

## 2019-02-16 NOTE — Telephone Encounter (Signed)
I [facility] trying to do PA via phone. They stated to call [facility] because of [facility] plan at [phone] to do urgent PA.

## 2019-02-16 NOTE — Telephone Encounter (Signed)
Urgent PA done via phone with [place] at [phone]. PA number [phone]. Dimethyl was sent as urgent due to pt not having any medication.

## 2019-02-16 NOTE — Telephone Encounter (Signed)
Pt has called to report that she now lives at [facility] , she is asking if her Gilford Raid can be called in to the pharmacy at their.  Pt has scheduled an annual f/u with Aundra Millet, NP

## 2019-02-16 NOTE — Telephone Encounter (Signed)
Jennifer,RN @ [facility] has called to speak with Sheilah Mins re: pt's Tecfidera.  She is asking for a call back @ [phone] when calling please ask for Victorino Dike, RN on unit 2

## 2019-02-16 NOTE — Telephone Encounter (Signed)
Paper prescription fax to [facility] twice and confirmed. PA done urgent see phone note dated 02/16/2019.

## 2019-02-16 NOTE — Telephone Encounter (Signed)
1) Medication(s) Requested (by name): Dimethyl Fumarate (TECFIDERA) 120 MG CPDR   2) Pharmacy of Choice: [facility] called to request a PA for patient.   States the patient is out of medication and is needing this medication. Please follow up.

## 2019-02-16 NOTE — Telephone Encounter (Signed)
Note   I [facility] trying to do PA via phone. They stated to call [facility] because of patients plan at [phone] to do urgent PA

## 2019-02-16 NOTE — Telephone Encounter (Signed)
I called pt to find out what  Pharmacy her rehab uses. She did not know what pharmacy to send tecfidera to.I advise pt to have her nurse to call us back to send electronically to their pharmacy or do we just send a paper prescription to the facility.The pt will have the nurse call back with information that's needed.

## 2019-02-16 NOTE — Telephone Encounter (Signed)
I called Ardine Eng and she stated pts family handle the medication and mailed it to the facility. They do not carry the medication at their facility pharmacy. I stated medication is requiring a PA. Victorino Dike verbalized understanding.

## 2019-02-16 NOTE — Telephone Encounter (Signed)
I called [facility] at [phone] trying to do stat PA on dimethyl fumarate. I called and was on hold for 17 minutes. I had to call again and was on hold more than 15 minutes. They stated medication needs a PA and I will have to call insurance company.

## 2019-02-17 NOTE — Telephone Encounter (Signed)
[employer] faxed additional PA questions to [place] after office hours last night. This morning I answered those questions, Dr. Anne Hahn signed, and the form was faxed back to [facility]. Received a receipt of confirmation.

## 2019-02-17 NOTE — Telephone Encounter (Addendum)
I called [employer] (did not see message below) to check on approval for the pt's medication. Spoke with Demitri. He stated they actually have a paid claim for BOTH generic and brand Tecfidera through the end of this year. Claim # [id number].   Spoke with Dr. Anne Hahn. He is ok with pt taking either brand or generic. I spoke with pt and let her know. I advised she can speak with pharmacy about delivery. She verbalized appreciation. I called [facility] and spoke with Debby Bud and advised the Tecfidera 120 mg has been approved by [employer], also generic approved. He verbalized appreciation.

## 2019-02-17 NOTE — Telephone Encounter (Signed)
PA has been approved. Approval is good from 01/16/2019-01/15/2020.

## 2019-02-18 ENCOUNTER — Other Ambulatory Visit: Payer: Self-pay

## 2019-02-18 ENCOUNTER — Telehealth: Payer: Self-pay | Admitting: Adult Health

## 2019-02-18 ENCOUNTER — Encounter: Payer: Self-pay | Admitting: Adult Health

## 2019-02-18 ENCOUNTER — Ambulatory Visit (INDEPENDENT_AMBULATORY_CARE_PROVIDER_SITE_OTHER): Payer: Medicare (Managed Care) | Admitting: Adult Health

## 2019-02-18 VITALS — BP 129/68 | HR 75 | Temp 98.2°F | Ht 62.0 in | Wt 115.0 lb

## 2019-02-18 DIAGNOSIS — G20 Parkinson's disease: Secondary | ICD-10-CM | POA: Diagnosis not present

## 2019-02-18 DIAGNOSIS — R413 Other amnesia: Secondary | ICD-10-CM

## 2019-02-18 DIAGNOSIS — G35 Multiple sclerosis: Secondary | ICD-10-CM

## 2019-02-18 NOTE — Patient Instructions (Addendum)
Your Plan:  Continue Sinemet, Seroquel, Aricept and Tecfidera Will discuss with Dr. Anne Hahn about stopping Tecfidera If your symptoms worsen or you develop new symptoms please let us know.    Thank you for coming to see [place] at [facility]. I hope we have been able to provide you high quality care today.  You may receive a patient satisfaction survey over the next few weeks. We would appreciate your feedback and comments so that we may continue to improve ourselves and the health of our patients.

## 2019-02-18 NOTE — Progress Notes (Signed)
PATIENT: Casey Young DOB: [birth year]  REASON FOR VISIT: follow up HISTORY FROM: patient  HISTORY OF PRESENT ILLNESS: Today 02/18/19:  Casey Young is a 74 year old female with a history of multiple sclerosis, Parkinson's disease and memory disturbance.  She returns today for follow-up.  She is here today with her daughter.  The patient is nonambulatory.  She uses a wheelchair.  She is able to stand for transfers.  She reports that the tremor primarily affects her right hand.  She also notices a mild tremor in both legs.  She states at times that she feels frozen.  She denies any trouble chewing or swallowing food.  She does have some trouble eating due to tremor.  She denies any trouble sleeping.  She is on melatonin she also takes Seroquel for night terrors.  She is currently in an assisted living facility. she returns today for an evaluation.  HISTORY Casey Young is a 74 year old right-handed white female with a history of multiple sclerosis diagnosed in the late 1990s.  The patient has been on Tecfidera, she has tolerated the medication fairly well.  She has developed symptoms of Parkinson's disease and she has developed a significant gait disorder associated with this.  The patient was changed from Stalevo to Sinemet CR in November 2019, the patient has had a further decline in her ability to ambulate, she has a stand-up walker but is still falling.  She has fallen recently on 02 January 2018, she fell forward and struck the left lower face.  The patient has had some vertigo since that time.  She never underwent a CT scan or MRI of the brain since the fall, they did x-rays, according to the patient they did not x-ray her cervical spine.  The patient is now having hallucinations, she will see people about the house and at church.  The patient is on high-dose amitriptyline taking 100 mg at night, she takes gabapentin presumably for her trigeminal neuralgia taking 600 mg twice per day.  The  patient is also on Wellbutrin and Cymbalta as an antidepressant medication.  She remains on methadone.  She returns to this office for an evaluation.  The patient is having 1 or 2 twinges a week of her trigeminal neuralgia.  REVIEW OF SYSTEMS: Out of a complete 14 system review of symptoms, the patient complains only of the following symptoms, and all other reviewed systems are negative.  See HPI  ALLERGIES: Allergies  Allergen Reactions  . Aubagio [Teriflunomide]     Increased LFT's  . Penicillins Rash    HOME MEDICATIONS: Outpatient Medications Prior to Visit  Medication Sig Dispense Refill  . amiodarone (PACERONE) 200 MG tablet Take 200 mg by mouth daily.    Marland Kitchen amLODipine (NORVASC) 10 MG tablet Take 10 mg by mouth daily.    Marland Kitchen aspirin 81 MG tablet Take 81 mg by mouth daily.    Marland Kitchen buPROPion (WELLBUTRIN XL) 300 MG 24 hr tablet Take 300 mg by mouth daily.    . Carbidopa-Levodopa ER (SINEMET CR) 25-100 MG tablet controlled release Take 1 tablet by mouth 4 (four) times daily. 360 tablet 1  . celecoxib (CELEBREX) 200 MG capsule Take 200 mg by mouth daily.    Marland Kitchen dalfampridine (AMPYRA) 10 MG TB12 Take 1 tablet (10 mg total) by mouth 2 (two) times daily. 180 tablet 3  . Dimethyl Fumarate (TECFIDERA) 120 MG CPDR Take 120 mg by mouth 2 (two) times daily. 180 capsule 3  . donepezil (ARICEPT) 5  MG tablet Take 1 tablet (5 mg total) by mouth at bedtime. 90 tablet 1  . DULoxetine (CYMBALTA) 60 MG capsule Take 60 mg by mouth daily.    Marland Kitchen gabapentin (NEURONTIN) 300 MG capsule Take 600 mg by mouth 2 (two) times daily.     . Lidocaine (HM LIDOCAINE PATCH) 4 % PTCH Apply topically.    . Melatonin 3 MG TABS     . methadone (DOLOPHINE) 10 MG tablet Take 10 mg by mouth 2 (two) times daily.    . ondansetron (ZOFRAN) 4 MG tablet TAKE 1 TABLET (4 MG TOTAL) BY MOUTH EVERY 8 (EIGHT) HOURS AS NEEDED FOR NAUSEA OR VOMITING. 90 tablet 1  . psyllium (METAMUCIL) 58.6 % packet Take by mouth.    . QUEtiapine (SEROQUEL)  50 MG tablet Take 1 tablet (50 mg total) by mouth at bedtime. 30 tablet 2  . TUMS ULTRA 1000 1000 MG chewable tablet     . Calcium-Magnesium-Vitamin D (CALCIUM 500 PO) Take 500 mg by mouth 2 (two) times daily.    . fesoterodine (TOVIAZ) 4 MG TB24 tablet Take 4 mg by mouth daily.    . mirabegron ER (MYRBETRIQ) 50 MG TB24 tablet Take 50 mg by mouth daily.    Marland Kitchen ALPRAZolam (XANAX) 0.5 MG tablet Take 2 tablets approximately 45 minutes prior to the MRI study, take a third tablet if needed. 3 tablet 0   No facility-administered medications prior to visit.    PAST MEDICAL HISTORY: Past Medical History:  Diagnosis Date  . Abnormality of gait 12/03/2013  . Anxiety   . Arthritis   . Asthma   . Chronic pain syndrome   . COPD (chronic obstructive pulmonary disease) (HCC)   . Degenerative arthritis   . Depression   . Diverticulosis   . Hypertension   . Memory deficits 12/03/2013  . MS (multiple sclerosis) (HCC)   . Neurogenic bladder 02/01/2015  . Pneumonia    over 5 yrs ago  . 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 UTI (urinary tract infection)     PAST SURGICAL HISTORY: Past Surgical History:  Procedure Laterality Date  . ABDOMINAL HYSTERECTOMY    . APPENDECTOMY    . BACK SURGERY     lumbar   x 3 surgeries  . CATARACT EXTRACTION Bilateral   . CHOLECYSTECTOMY    . SHOULDER HEMI-ARTHROPLASTY  05/07/2011   Procedure: SHOULDER HEMI-ARTHROPLASTY;  Surgeon: Raymon Mutton, MD;  Location: [facility] OR;  Service: Orthopedics;  Laterality: Left;  . TONSILLECTOMY    . TUBAL LIGATION Bilateral     FAMILY HISTORY: Family History  Problem Relation Age of Onset  . Stroke Mother   . Dementia Mother   . Heart attack Father   . Heart failure Sister   . Diabetes Brother   . Heart disease Brother     SOCIAL HISTORY: Social History   Socioeconomic History  . Marital status: Married    Spouse name: Not on file  . Number of children: 1  . Years of education: 35  . Highest education  level: Not on file  Occupational History  . Occupation: retired  Tobacco Use  . Smoking status: Never Smoker  . Smokeless tobacco: Never Used  Substance and Sexual Activity  . Alcohol use: No  . Drug use: No  . Sexual activity: Not on file  Other Topics Concern  . Not on file  Social History Narrative   Patient drinks 1 cup of coffee daily.  Patient is right handed.   Social Determinants of Health   Financial Resource Strain:   . Difficulty of Paying Living Expenses: Not on file  Food Insecurity:   . Worried About Charity fundraiser in the Last Year: Not on file  . Ran Out of Food in the Last Year: Not on file  Transportation Needs:   . Lack of Transportation (Medical): Not on file  . Lack of Transportation (Non-Medical): Not on file  Physical Activity:   . Days of Exercise per Week: Not on file  . Minutes of Exercise per Session: Not on file  Stress:   . Feeling of Stress : Not on file  Social Connections:   . Frequency of Communication with Friends and Family: Not on file  . Frequency of Social Gatherings with Friends and Family: Not on file  . Attends Religious Services: Not on file  . Active Member of Clubs or Organizations: Not on file  . Attends Archivist Meetings: Not on file  . Marital Status: Not on file  Intimate Partner Violence:   . Fear of Current or Ex-Partner: Not on file  . Emotionally Abused: Not on file  . Physically Abused: Not on file  . Sexually Abused: Not on file      PHYSICAL EXAM  Vitals:   02/18/19 1306  BP: 129/68  Pulse: 75  Temp: 98.2 F (36.8 C)  Weight: 115 lb (52.2 kg)  Height: 5\' 2"  (1.575 m)   Body mass index is 21.03 kg/m.  Generalized: Well developed, in no acute distress   Neurological examination  Mentation: Alert oriented to time, place, history taking. Follows all commands speech is dysphonic Cranial nerve II-XII: Pupils were equal round reactive to light. Extraocular movements were full, visual  field were full on confrontational test. Facial sensation and strength were normal. Uvula tongue midline. Head turning and shoulder shrug  were normal and symmetric. Motor: The motor testing reveals 4/5 strength in the upper extremities.  3 out of 5 in the lower extremities.  Resting tremor noted in right hand Sensory: Sensory testing is intact to soft touch on all 4 extremities. No evidence of extinction is noted.  Coordination: Cerebellar testing reveals good finger-nose-finger.  Intention tremor noted bilaterally Gait and station: Patient is in a wheelchair   DIAGNOSTIC DATA (LABS, IMAGING, TESTING) - I reviewed patient records, labs, notes, testing and imaging myself where available.  Lab Results  Component Value Date   WBC 3.2 (L) 01/20/2018   HGB 13.5 01/20/2018   HCT 39.8 01/20/2018   MCV 85 01/20/2018   PLT 259 01/20/2018      Component Value Date/Time   NA 142 01/20/2018 1301   K 4.1 01/20/2018 1301   CL 104 01/20/2018 1301   CO2 23 01/20/2018 1301   GLUCOSE 97 01/20/2018 1301   GLUCOSE 125 (H) 05/08/2011 0512   BUN 19 01/20/2018 1301   CREATININE 0.77 01/20/2018 1301   CALCIUM 9.0 01/20/2018 1301   PROT 6.8 01/20/2018 1301   ALBUMIN 4.4 01/20/2018 1301   AST 27 01/20/2018 1301   ALT 34 (H) 01/20/2018 1301   ALKPHOS 97 01/20/2018 1301   BILITOT 0.4 01/20/2018 1301   GFRNONAA 77 01/20/2018 1301   GFRAA 89 01/20/2018 1301    Lab Results  Component Value Date   VITAMINB12 444 01/20/2018   Lab Results  Component Value Date   TSH 1.730 01/20/2018      ASSESSMENT AND PLAN 74 y.o. year old female  has a past medical history of Abnormality of gait (12/03/2013), Anxiety, Arthritis, Asthma, Chronic pain syndrome, COPD (chronic obstructive pulmonary disease) (HCC), Degenerative arthritis, Depression, Diverticulosis, Hypertension, Memory deficits (12/03/2013), MS (multiple sclerosis) (HCC), Neurogenic bladder (02/01/2015), Pneumonia, Ulnar neuropathy at elbow  (02/01/2015), and UTI (urinary tract infection). here with:  1.  Multiple sclerosis  -Discontinue Ampyra patient is no longer ambulatory -Discussed possibly discontinue Tecfidera will discuss with Dr. Anne Hahn  2.  Parkinson's disease  -Continue Sinemet -Continue Seroquel  3.  Memory disturbance  -Continue Aricept   I spent 30 minutes with the patient. 50% of this time was spent reviewing paperwork from facility and discussing plan of care   Butch Penny, MSN, NP-C 02/18/2019, 1:15 PM [facility] [address] [place], [place] [address] [phone]

## 2019-02-18 NOTE — Telephone Encounter (Signed)
We will discontinue tecfidera. Please call Daughter and make her aware. Please call facility.

## 2019-02-18 NOTE — Telephone Encounter (Signed)
-----   Message from York Spaniel, MD sent at 02/18/2019  3:36 PM EST ----- Telling what is what with this patient is difficult, she has Parkinson's disease, memory problems, and MS, the walking difficulty may be more related to the Parkinson's disease.  I certainly agree with stopping the Ampyra, the Tecfidera does very little for progressive MS, could discontinue this and follow the patient.  I think that a lot of her disability is related to the Parkinson's disease at this time. ----- Message ----- From: Butch Penny, NP Sent: 02/18/2019   2:12 PM EST To: York Spaniel, MD  Hey Dr. Anne Hahn,   I saw this patient today. She is wheelchair bound. Last MRI was 2020 was stable. I am discontinuing ampyra. Do you feel that she should stay on Tecfidera?  Aundra Millet

## 2019-02-18 NOTE — Progress Notes (Signed)
I have read the note, and I agree with the clinical assessment and plan.  Casey Young   

## 2019-02-19 NOTE — Telephone Encounter (Signed)
I called pts daughter, Lorne Skeens and relayed that per MM/NP and CW/MD will stop tecfidera.  Will calll facility as well to discontinue.

## 2019-02-19 NOTE — Telephone Encounter (Signed)
I called and LMVM for daughter abot stopping the tecfidera.  I LMVM for [facility] to return call about order for pt.

## 2019-02-23 NOTE — Telephone Encounter (Signed)
I called [facility], Caroll Rancher, NP see's pt.  Per MM/NP here at [facility]. Will be cancelling tecfidera for pt.  They are to call back if needed.

## 2019-03-09 ENCOUNTER — Other Ambulatory Visit: Payer: Self-pay | Admitting: Neurology

## 2019-03-09 MED ORDER — DIMETHYL FUMARATE 120 MG PO CPDR: 120.0000 mg | DELAYED_RELEASE_CAPSULE | Freq: Two times a day (BID) | ORAL | 3 refills | Status: DC

## 2019-03-11 ENCOUNTER — Telehealth: Payer: Self-pay | Admitting: Adult Health

## 2019-03-11 NOTE — Telephone Encounter (Signed)
Victorino Dike from [facility] called needing to discuss the pt's Dimethyl Fumarate (TECFIDERA) 120 MG CPDR with RN. Please advise. Ask for [facility] [phone] [address]

## 2019-03-12 NOTE — Telephone Encounter (Signed)
I called [facility] RN and relayed per notes that pt is no longer on tecfidera. I faxed to her at [phone] messages related to this, with fax confirmation.

## 2019-03-12 NOTE — Addendum Note (Signed)
Addended by: Guy Begin on: 03/12/2019 01:22 PM   Modules accepted: Orders

## 2019-04-01 ENCOUNTER — Telehealth: Payer: Self-pay | Admitting: Adult Health

## 2019-04-01 NOTE — Telephone Encounter (Signed)
Pt called stating that the doctor in the nursing home where she is informed her that her provider took her off amLODipine (NORVASC) 10 MG tablet and she is wanting to know why she was taken off of it. Please advise.

## 2019-04-01 NOTE — Telephone Encounter (Signed)
I called and LMVM for pt that we do not prescribe amlodipine for her, and pcp is the office she needs to connect with.

## 2019-04-06 ENCOUNTER — Telehealth: Payer: Self-pay | Admitting: Adult Health

## 2019-04-06 NOTE — Telephone Encounter (Signed)
Pt called wanting to speak to the RN to see why she was taken off of the dalfampridine (AMPYRA) 10 MG TB12 Please advise.

## 2019-04-06 NOTE — Telephone Encounter (Signed)
I called pt and she is residing in [facility] and [address].  She had question about being off ampyra.  I relayed that from last ofv note that you were not ambulatory (wheelchair bound) that amypyra was discontinued.  She appreciated call.

## 2023-02-16 DEATH — deceased
